# Patient Record
Sex: Female | Born: 1975 | Race: Black or African American | Hispanic: No | Marital: Single | Smoking: Never smoker
Health system: Southern US, Community
[De-identification: ages and names within clinical notes are randomized; demographics above are authoritative.]

## PROBLEM LIST (undated history)

## (undated) DIAGNOSIS — I10 Essential (primary) hypertension: Secondary | ICD-10-CM

## (undated) DIAGNOSIS — E119 Type 2 diabetes mellitus without complications: Secondary | ICD-10-CM

## (undated) HISTORY — PX: BREAST BIOPSY: SHX20

---

## 2000-05-07 ENCOUNTER — Other Ambulatory Visit: Admission: RE | Admit: 2000-05-07 | Discharge: 2000-05-07 | Payer: Self-pay | Admitting: Obstetrics and Gynecology

## 2002-05-05 ENCOUNTER — Other Ambulatory Visit: Admission: RE | Admit: 2002-05-05 | Discharge: 2002-05-05 | Payer: Self-pay | Admitting: Obstetrics and Gynecology

## 2002-09-13 ENCOUNTER — Emergency Department (HOSPITAL_COMMUNITY): Admission: EM | Admit: 2002-09-13 | Discharge: 2002-09-13 | Payer: Self-pay | Admitting: *Deleted

## 2003-02-22 ENCOUNTER — Other Ambulatory Visit: Admission: RE | Admit: 2003-02-22 | Discharge: 2003-02-22 | Payer: Self-pay | Admitting: Obstetrics and Gynecology

## 2003-08-09 ENCOUNTER — Other Ambulatory Visit: Admission: RE | Admit: 2003-08-09 | Discharge: 2003-08-09 | Payer: Self-pay | Admitting: Obstetrics and Gynecology

## 2005-02-05 ENCOUNTER — Other Ambulatory Visit: Admission: RE | Admit: 2005-02-05 | Discharge: 2005-02-05 | Payer: Self-pay | Admitting: Obstetrics and Gynecology

## 2008-01-05 ENCOUNTER — Emergency Department (HOSPITAL_COMMUNITY): Admission: EM | Admit: 2008-01-05 | Discharge: 2008-01-05 | Payer: Self-pay | Admitting: Emergency Medicine

## 2008-02-12 ENCOUNTER — Ambulatory Visit: Payer: Self-pay | Admitting: Cardiology

## 2008-02-26 ENCOUNTER — Encounter: Payer: Self-pay | Admitting: Cardiology

## 2008-02-26 ENCOUNTER — Ambulatory Visit: Payer: Self-pay

## 2010-10-25 ENCOUNTER — Other Ambulatory Visit: Payer: Self-pay | Admitting: Obstetrics and Gynecology

## 2010-10-26 ENCOUNTER — Other Ambulatory Visit: Payer: Self-pay | Admitting: Obstetrics and Gynecology

## 2010-10-26 DIAGNOSIS — Z1231 Encounter for screening mammogram for malignant neoplasm of breast: Secondary | ICD-10-CM

## 2010-11-09 ENCOUNTER — Inpatient Hospital Stay (INDEPENDENT_AMBULATORY_CARE_PROVIDER_SITE_OTHER)
Admission: RE | Admit: 2010-11-09 | Discharge: 2010-11-09 | Disposition: A | Discharge status: Home / Self Care | Payer: 59 | Source: Ambulatory Visit | Attending: Family Medicine | Admitting: Family Medicine

## 2010-11-09 DIAGNOSIS — K5289 Other specified noninfective gastroenteritis and colitis: Secondary | ICD-10-CM

## 2010-11-09 LAB — COMPREHENSIVE METABOLIC PANEL
ALT: 101 U/L — ABNORMAL HIGH (ref 0–35)
AST: 88 U/L — ABNORMAL HIGH (ref 0–37)
Albumin: 3.5 g/dL (ref 3.5–5.2)
Calcium: 9.5 mg/dL (ref 8.4–10.5)
GFR calc Af Amer: >60 mL/min (ref >60)
Potassium: 4 mEq/L (ref 3.5–5.1)
Sodium: 139 mEq/L (ref 135–145)
Total Protein: 8.2 g/dL (ref 6.0–8.3)

## 2010-11-09 LAB — POCT I-STAT, CHEM 8
HCT: 39 % (ref 36.0–46.0)
Hemoglobin: 13.3 g/dL (ref 12.0–15.0)
Potassium: 4.6 mEq/L (ref 3.5–5.1)
Sodium: 140 mEq/L (ref 135–145)
TCO2: 29 mmol/L (ref 0–100)

## 2010-11-09 LAB — POCT URINALYSIS DIPSTICK
Nitrite: NEGATIVE
Urine Glucose, Fasting: NEGATIVE mg/dL
Urobilinogen, UA: 1 mg/dL (ref 0.0–1.0)

## 2010-11-09 LAB — POCT PREGNANCY, URINE: Preg Test, Ur: NEGATIVE

## 2010-11-17 ENCOUNTER — Ambulatory Visit
Admission: RE | Admit: 2010-11-17 | Discharge: 2010-11-17 | Disposition: A | Discharge status: Home / Self Care | Payer: 59 | Source: Ambulatory Visit | Attending: Obstetrics and Gynecology | Admitting: Obstetrics and Gynecology

## 2010-11-17 DIAGNOSIS — Z1231 Encounter for screening mammogram for malignant neoplasm of breast: Secondary | ICD-10-CM

## 2010-11-21 ENCOUNTER — Other Ambulatory Visit: Payer: Self-pay | Admitting: Obstetrics and Gynecology

## 2010-11-21 DIAGNOSIS — R928 Other abnormal and inconclusive findings on diagnostic imaging of breast: Secondary | ICD-10-CM

## 2010-11-23 ENCOUNTER — Other Ambulatory Visit (HOSPITAL_COMMUNITY): Payer: Self-pay | Admitting: Family Medicine

## 2010-11-23 DIAGNOSIS — R7989 Other specified abnormal findings of blood chemistry: Secondary | ICD-10-CM

## 2010-12-05 ENCOUNTER — Ambulatory Visit
Admission: RE | Admit: 2010-12-05 | Discharge: 2010-12-05 | Disposition: A | Discharge status: Home / Self Care | Payer: 59 | Source: Ambulatory Visit | Attending: Obstetrics and Gynecology | Admitting: Obstetrics and Gynecology

## 2010-12-05 DIAGNOSIS — R928 Other abnormal and inconclusive findings on diagnostic imaging of breast: Secondary | ICD-10-CM

## 2010-12-08 ENCOUNTER — Ambulatory Visit (HOSPITAL_COMMUNITY)
Admission: RE | Admit: 2010-12-08 | Discharge: 2010-12-08 | Disposition: A | Discharge status: Home / Self Care | Payer: 59 | Source: Ambulatory Visit | Attending: Family Medicine | Admitting: Family Medicine

## 2010-12-08 DIAGNOSIS — R7989 Other specified abnormal findings of blood chemistry: Secondary | ICD-10-CM | POA: Insufficient documentation

## 2011-01-30 NOTE — Assessment & Plan Note (Signed)
[facility]                            CARDIOLOGY OFFICE NOTE   Marie Murray                      MRN:          [id number]  DATE:02/12/2008                            DOB:          Mar 13, 1976    Marie Murray is a 35 year old female with no prior cardiac history.  I  am asked to evaluate for chest pain.  Note she typically does not have  dyspnea on exertion, orthopnea, PND, pedal edema, palpitations,  presyncope, syncope, exertional chest pain.  However, over the past  several months, she has is noticed the pain in the upper chest area.  She describes it as a choking sensation.  It does not radiate.  It is  not pleuritic, positional, nor is it related to food.  It is not  exertional.  There is no associated nausea, vomiting, shortness of  breath, or diaphoresis.  It lasts for 2 hours and resolves  spontaneously.  There is a question whether it improves with belching.  Because of the above, we were asked to further evaluate.   MEDICATIONS:  1. Metformin 1 gram p.o. daily.  2. Lisinopri1/HCTZ 25/12.5 daily.  3. Aspirin 81 mg daily.  4. Depo-Provera.  5. Actos 30 mg daily.  6. Sleeptime sleep aid.   SOCIAL HISTORY:  She does not smoke, only rarely consumes alcohol.   FAMILY HISTORY:  Positive for coronary disease.  Mother had recent  stents placed by her report.   PAST MEDICAL HISTORY:  1. Diabetes mellitus for approximately one year.  2. She also has hypertension.  3. Mild hyperlipidemia.   PAST SURGICAL HISTORY:  She has had no previous surgeries.   REVIEW OF SYSTEMS:  She denies any headaches or fevers or chills.  There  is no productive cough or hemoptysis.  There is no dysphagia,  odynophagia, melena, hematochezia.  There is no dysuria or hematuria.  No rash or seizure activity.  There is no orthopnea, PND, pedal edema.  Remaining systems are negative.   PHYSICAL EXAMINATION:  VITAL SIGNS:  Blood pressure of 118/76 and pulse  is 92.  She weighs 189 pounds.  GENERAL:  She is well-developed and somewhat obese.  She is no acute  distress at present.  She does appear depressed.  There is no peripheral  clubbing.  SKIN:  Skin is warm and dry.  BACK:  Normal.  HEENT:  Normal with normal eyelids.  NECK:  Supple with a normal upstroke bilaterally.  No bruits noted.  There is no jugular distention and no thyromegaly.  CHEST:  Chest is clear to auscultation.  Normal expansion.  CARDIOVASCULAR:  Regular rhythm.  Normal S1 and S2.  No murmurs, rubs or  gallops noted.  ABDOMEN:  Nontender.  Positive bowel sounds. No hepatosplenomegaly, no  mass appreciated.  There is no abdominal bruit.  She has 2+ femoral  pulses bilaterally.  No bruits.  EXTREMITIES:  No edema.  I palpate no cords.  She is 2+ dorsalis pedis  pulses bilaterally.  NEUROLOGIC:  Grossly intact.   Electrocardiogram shows sinus rhythm at a rate of 93.  There is  mild  left ventricle hypertrophy.  There is no ST changes.   DIAGNOSES:  1. Atypical chest pain.  Her symptoms may be GI in etiology.  However,      she does have multiple risk factors including diabetes, family      history, as well as hypertension and a question of mild      hyperlipidemia.  We will schedule a stress echocardiogram.  If it      shows  shows no abnormalities, we will not pursue further ischemia      evaluation.  She will follow up with her primary care physician if      her  symptoms persist.  2. Diabetes mellitus.  Management per primary care physician.  3. Hypertension.  Blood pressure is adequately controlled on present      medications.   We will see her back on as-needed basis, pending results of her stress  echocardiogram.     Marie Murray. Marie Som, MD, [facility]  Electronically Signed    BSC/MedQ  DD: 02/12/2008  DT: 02/12/2008  Job #: [id number]   cc:   Celso Amy, PA

## 2011-03-02 ENCOUNTER — Other Ambulatory Visit (HOSPITAL_COMMUNITY): Payer: Self-pay | Admitting: Family Medicine

## 2011-03-02 DIAGNOSIS — K824 Cholesterolosis of gallbladder: Secondary | ICD-10-CM

## 2011-03-08 ENCOUNTER — Ambulatory Visit (HOSPITAL_COMMUNITY)
Admission: RE | Admit: 2011-03-08 | Discharge: 2011-03-08 | Disposition: A | Discharge status: Home / Self Care | Payer: 59 | Source: Ambulatory Visit | Attending: Family Medicine | Admitting: Family Medicine

## 2011-03-08 DIAGNOSIS — K824 Cholesterolosis of gallbladder: Secondary | ICD-10-CM | POA: Insufficient documentation

## 2012-04-23 IMAGING — US US ABDOMEN COMPLETE
1 series · 14 of 25 positions shown · non-contrast
Comparison: None.

CLINICAL DATA: Elevated LFTs

COMPLETE ABDOMINAL ULTRASOUND

[Series 1: us abdomen complete · 0.28mm/px · 14 of 69 slices shown]
[im 1/69]
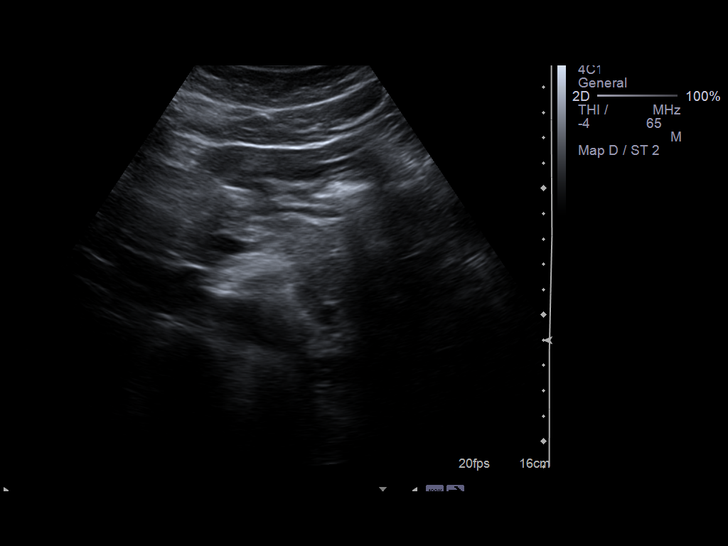
[im 6/69]
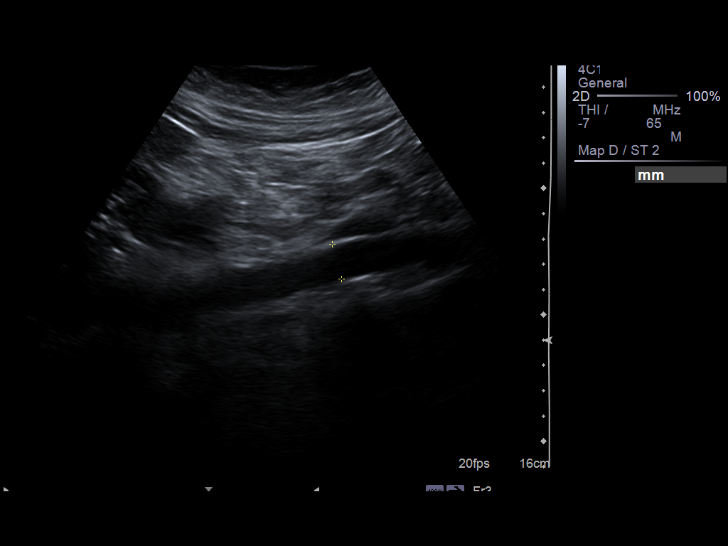
[im 12/69]
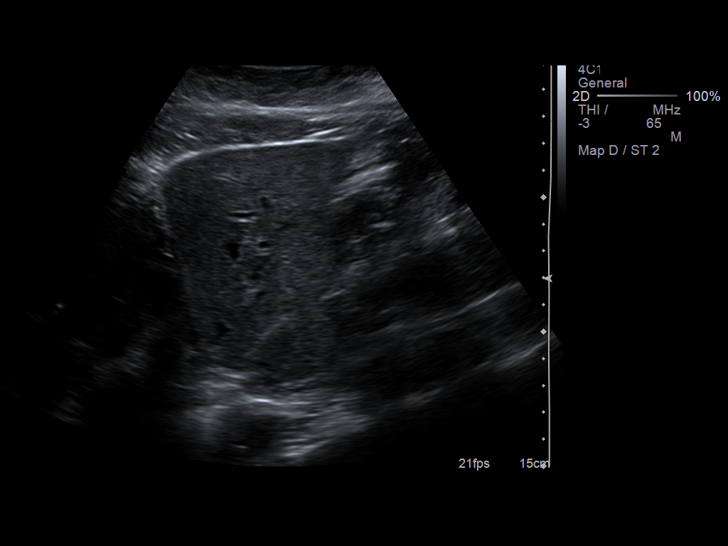
[im 18/69]
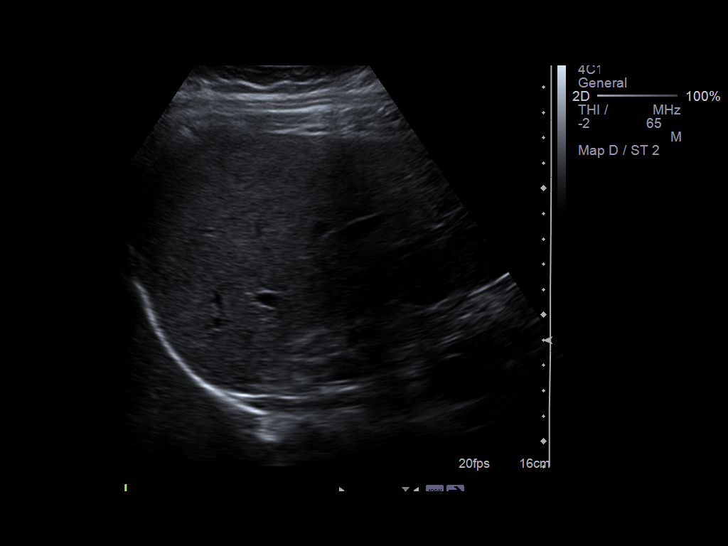
[im 23/69]
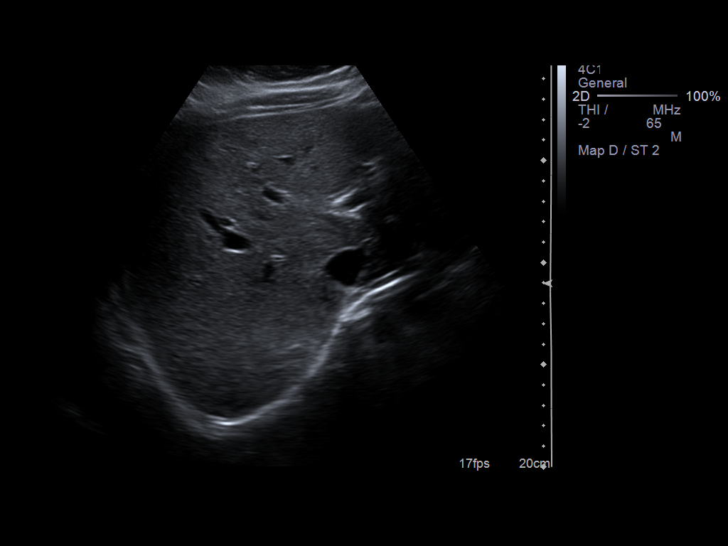
[im 26/69]
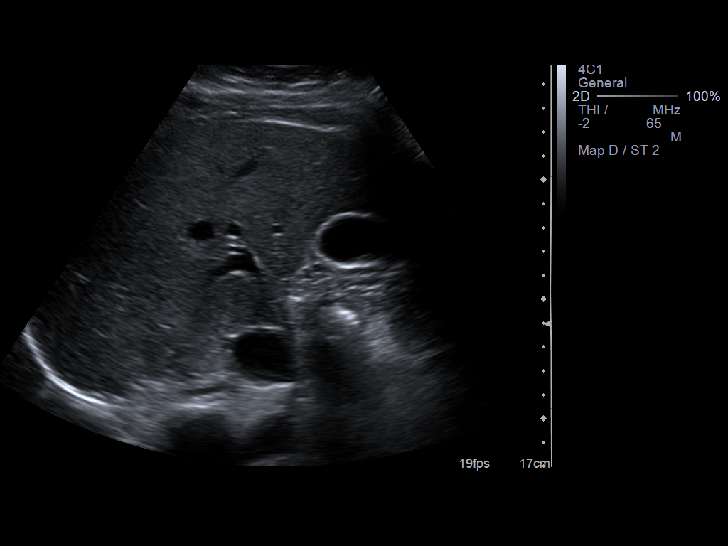
[im 32/69]
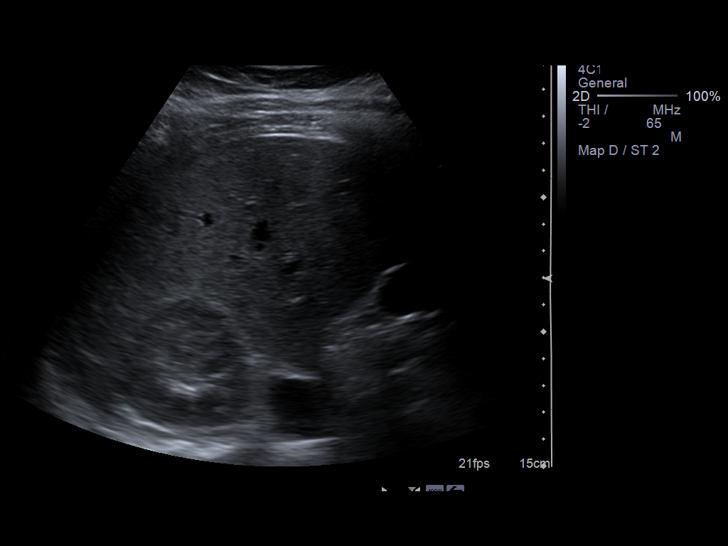
[im 37/69]
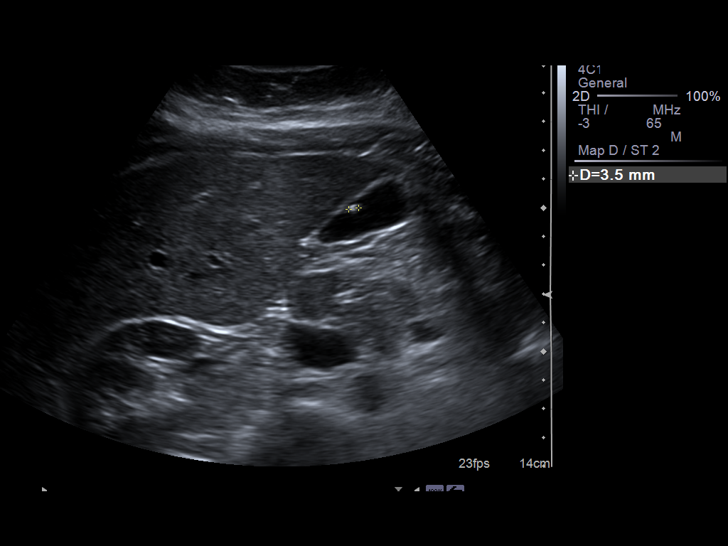
[im 43/69]
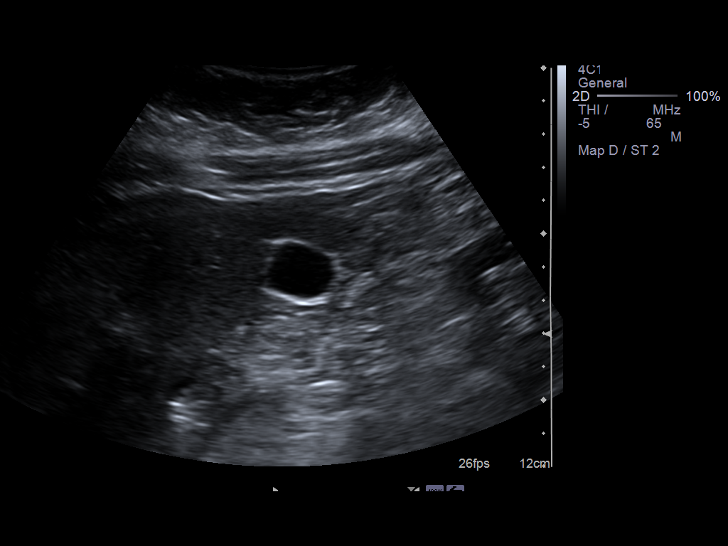
[im 46/69]
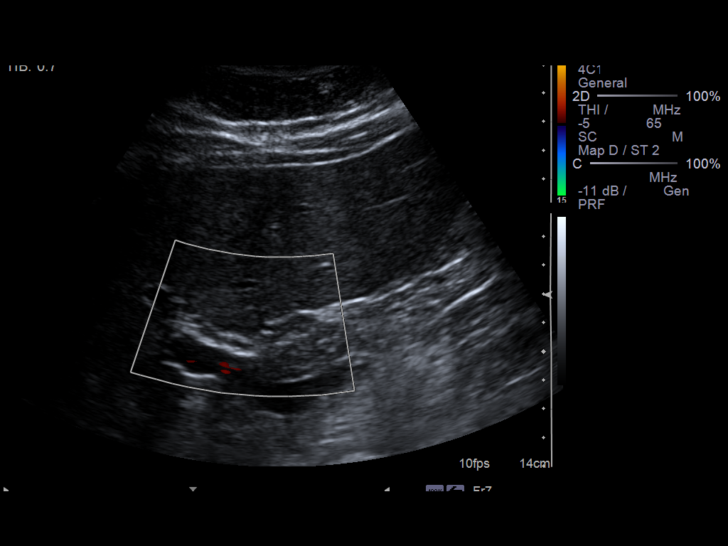
[im 52/69]
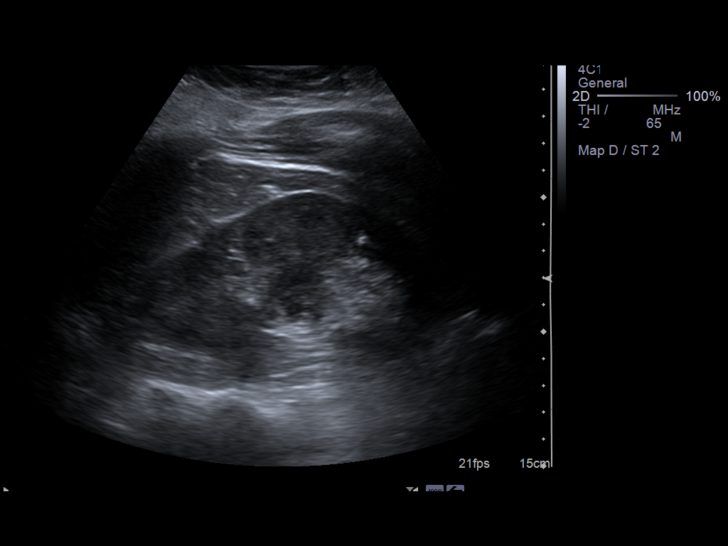
[im 57/69]
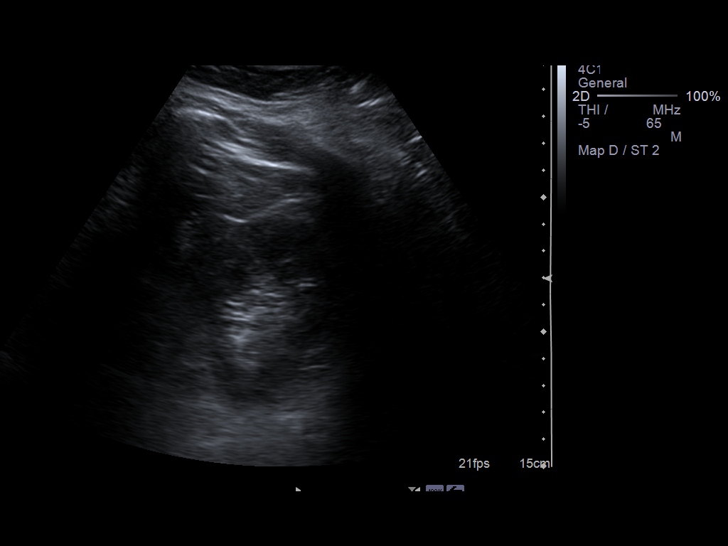
[im 63/69]
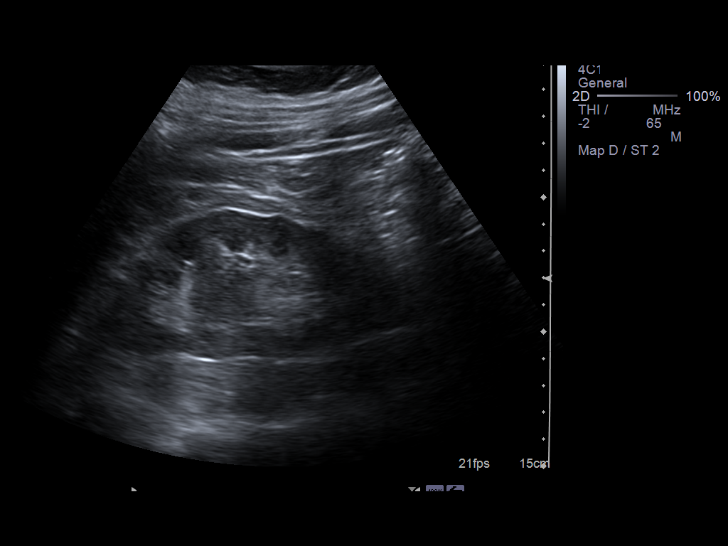
[im 69/69]
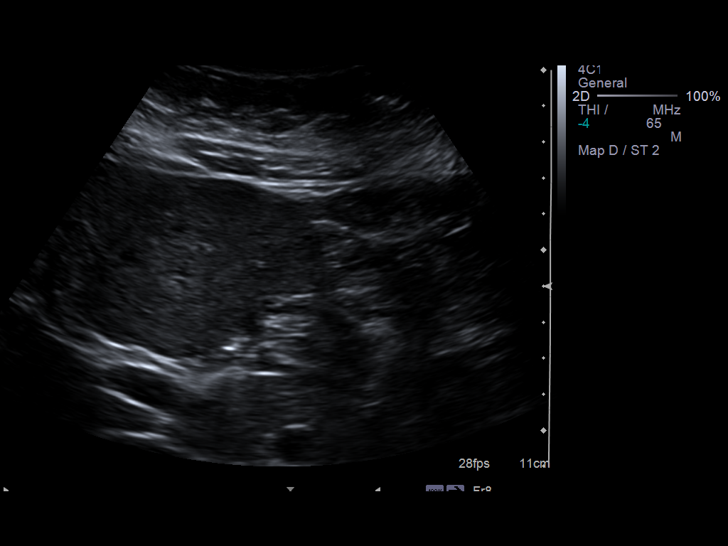

[14 of 25 positions shown; findings below may reference images not displayed]

FINDINGS: Gallbladder:  No gallstones, gallbladder wall thickening, or
pericholecystic fluid. A probable gallbladder wall polyp measures 4
mm.  Follow-up ultrasound in 3 months is suggested to assure
stability. No sonographic Murphy's sign.

Common bile duct:  Measures 2.8 mm in diameter within normal
limits.

Liver:  No focal lesion identified.  Within normal limits in
parenchymal echogenicity.

IVC:  Appears normal.

Pancreas:  No focal abnormality seen.

Spleen:  Measures 5.3 cm in length.  Normal echogenicity.

Right Kidney:  Measures 11.8 cm in length.  No hydronephrosis or
diagnostic renal calculus

Left Kidney:  Measures 11.6 cm in length.  No hydronephrosis or
diagnostic renal calculus

Abdominal aorta:  No aneurysm identified. Measures up to 1.8 cm in
diameter.
IMPRESSION: 1.  No gallstones are noted within gallbladder.  No thickening of
gallbladder wall.  No sonographic Murphy's sign.  Probable 4 mm
gallbladder wall polyp.  Follow-up ultrasound in 3 months is
suggested to assure stability.
2.  No hydronephrosis or diagnostic renal calculus.

## 2012-12-03 ENCOUNTER — Ambulatory Visit (INDEPENDENT_AMBULATORY_CARE_PROVIDER_SITE_OTHER): Payer: Self-pay | Admitting: Family Medicine

## 2012-12-03 VITALS — BP 138/88 | Wt 183.0 lb

## 2012-12-03 DIAGNOSIS — E119 Type 2 diabetes mellitus without complications: Secondary | ICD-10-CM | POA: Insufficient documentation

## 2012-12-03 NOTE — Progress Notes (Signed)
Marie Murray presents today for DM follow-up as part of the employer-sponsored [employer] program. She is getting back on track with her quarterly visits after being lost to follow-up for the previous year. Current DM regimen includes Metformin; however, Marie Murray is highly non-compliant with this. She also continues on daily ACEi for HTN. Marie Murray does not recall the last follow-up with Dr. Wynelle Link, but knows that she is due for a yearly exam.   Diabetes Assessment: Type of Diabetes: Type 2; Sees Diabetes provider 1 time a year; MD managing Diabetes Deatra James, MD; checks blood glucose 1-2 times a week; does not take medications as prescribed; does not take an aspirin a day; Highest CBG 170; Lowest CBG 88; hypoglycemia frequency None reported; A1c = 10.2 (8.0 in 11/2102) Other Diabetes History: Marie Murray did not bring meter today but reports testing 1-2 times per week.  She denies symptoms of hypo or hyperglycemia.  Marie Murray's current DM regimen includes Metformin ER 500 mg daily. However, Marie Murray has not been taking this medication regularly in quite some time (90d supply has lasted >9 months). Marie Murray reports she wants to control DM through diet and exercise alone and is attempting to "wean" herself off Metformin. She also reports GI intolerance with each dose of metformin. I have made her aware that GI intolerance will not resolve until she can maintain compliance with Metformin. At this rate, her body does not have a chance to adjust to the medication. In the past, Dr. Wynelle Link has prescribed both Metformin and Glipizide, neither of which have ever been filled. A1c today has increased to 10.2 (8.0 in 11/2011). I have discussed medications with Marie Murray. Despite positive dietary changes, Marie Murray's A1c continues to rise. At this point I do not anticipate that Marie Murray will be able to gain DM control without use of medications. We have discussed resuming Metformin at daily dosing. Have also discussed possible addition of a  second agent, but that decision will be left to Dr. Wynelle Link. Marie Murray has an appt with Dr. Wynelle Link in the next three months and will also call her today to verify that appt. I will also fax Dr. Wynelle Link with a summary of today's visit and A1c results. I have also reviewed with Marie Murray possible complications that may result if her DM is left untreated.   Lifestyle Factors: Diet - Marie Murray has made improvements since our last visit. Increased fruits/veg, is eating less fried foods, eating mostly lean protein and limiting red meat to once weekly, has eliminated pork, and is consuming more water. Pt reports portion sizes are under good control. Very few sweets. Very rarely has icecream or other sweet treats.   Exercise - Marie Murray was participating in zumba multiple times per week; however, this has stopped. She is now walking in her neighborhood for about 15 min 1-2 times per week. In addition she has a number of household chores that she believes count as exercise. Marie Murray reports enjoying group exercise classes, but is not participating in any at this time. Barriers to exercise are lack of motivation due to depression and lack of time.  Psyche - Marie Murray has had a difficult year with many emotional and financial stressors.  She admits to struggling with depression and knows this has had an impact on her self-care and motivation to exercise. Marie Murray does not wish to take medication for depression and feels that her symptoms are improving.  She also recognizes that exercise and being around others helps with this.  She is actively involved in community service  and enjoys spending time with her daughter.    Assessment: Marie Murray is a poorly controlled diabetic. A1c has increased to 10.2. Diet is under fairly good control and Marie Murray has attempted to make healthy changes. Exercise regimen is minimal at this time, and Marie Murray is hesitant to set any additional goals due to lack of time and motivation. I am hopeful that Marie Murray will  resume Metformin and will speak with Dr. Wynelle Link about a new plan for gaining control of DM. Marie Murray will see Dr. Wynelle Link within the next 3 months and will return for follow-up with [employer] [facility] in 3 months.   Plan: 1) Maintain healthy dietary changes and continue to make healthy choices 2) Continue exercising at least twice per week, walking for at least 15 minutes 3) Continue testing regularly 4) Resume Metformin at prescribed dose  5) Follow-up in 3 months on Wed June 19th @ 8:30 am

## 2013-03-04 ENCOUNTER — Ambulatory Visit (INDEPENDENT_AMBULATORY_CARE_PROVIDER_SITE_OTHER): Payer: 59 | Admitting: Family Medicine

## 2013-03-04 VITALS — BP 142/84 | Wt 183.0 lb

## 2013-03-04 DIAGNOSIS — E119 Type 2 diabetes mellitus without complications: Secondary | ICD-10-CM

## 2013-03-04 NOTE — Progress Notes (Signed)
Patient presents today for 3 month diabetes follow-up as part of the employer-sponsored [employer] program. Current diabetes regimen includes Metformin. Patient also continues on daily ACEi. Patient has not followed-up with MD but will hopefully make an appt soon. No med changes or major health changes at this time.   Diabetes Assessment: Type of Diabetes: Type 2; MD managing Diabetes Marie James, MD; checks blood glucose once daily; does not take medications as prescribed; does not take an aspirin a day; Highest CBG 180; Lowest CBG 98; A1c 9.2 (prev 10.2) Other Diabetes History: Current med regimen includes Metformin ER 500 mg twice daily. Patient reports starting this medication after our most recent follow-up, 3 months ago. She reports compliance but according to refill history she continues struggling with this. Patient does seem to be tolerating metformin except for mild GI upset on occassion. Patient did not bring meter today but is currently testing 1 times per day, at random times. She reports glucose range of 98-180. Hypoglycemia is rare. Patient denies signs and symptoms of neuropathy including numbness/tingling/burning and symptoms of foot infection. Patient is not due for yearly eye exam. She is due for follow-up with MD. I have asked her to make an appt to discuss A1c.   Lifestyle Factors: Diet - Patient is attempting to maintain a healthy diet but reports she would be interested in seeing a dietician. She reports the following for food recall:  Breakfast - [place] sausage, fruit, grits, [employer] - prepares her own lunch, often a salad with grilled chicken, pecans, vinegrette dressing Dinner - salad with cucumber, cheese, etc and a mini cheeseburger slider Snacking - attempts to make healthy choices, apple, grapes, carrots  Exercise - Patient participates in [facility] exercise in park two days per week, which often includes zumba, dance, aerobics, kick boxing, african  dance, yoga, etc. Usually lasts about an hour. Patient will soon start [employer] program. As part of this program, pt will meet with a physical trainer three days per week for 12 weeks.   Assessment: Patient is a poorly controlled diabetic. A1c has decreased to 9.2 (from 10.2) after resuming Metformin. Diet is under fairly good control and patient has attempted to maintain healthy changes. Exercise regimen has improved at this time, and will hopefully continue to improve once patient starts BELT program. Patient continues to struggle with medication compliance but has resumed Metformin. I am hopeful she will schedule a follow-up with MD soon to discuss a new plan for gaining control of DM. She will return for follow-up with [employer] [facility] in 3 months.   Plan: 1) Continue current medication regimen 2) Schedule a follow-up with Dr. Wynelle Link 3) Maintain compliance with BELT program 4) Continue making healthy dietary choices, focus on a healthier breakfast, high in fiber 5) I will contact you with info on how to see a dietician 6) Follow-up in 3 months on Wednesday September 17th @ 11:30 am

## 2013-06-08 NOTE — Progress Notes (Signed)
Patient ID: Marie Murray, female   DOB: 04/20/1976, 37 y.o.   MRN: [id number] ATTENDING PHYSICIAN NOTE: I have reviewed the chart and agree with the plan as detailed above. Denny Levy MD Pager [phone]

## 2013-07-14 ENCOUNTER — Ambulatory Visit (INDEPENDENT_AMBULATORY_CARE_PROVIDER_SITE_OTHER): Payer: Self-pay | Admitting: Family Medicine

## 2013-07-14 VITALS — BP 128/78 | Wt 180.0 lb

## 2013-07-14 DIAGNOSIS — E119 Type 2 diabetes mellitus without complications: Secondary | ICD-10-CM

## 2013-07-14 NOTE — Progress Notes (Signed)
Patient presents today for 3 month diabetes follow-up as part of the employer-sponsored [employer] program. Current diabetes regimen includes Metformin and glipizide. Patient also continues on daily ACEi. Most recent MD follow-up was 06/10/13. Patient has a pending appt for December. No med changes or major health changes at this time.   Diabetes Assessment: Type of Diabetes: Type 2; Sees Diabetes provider 1 time a year; MD managing Diabetes Marie James, MD; uses glucometer; does not take medications as prescribed; does not take an aspirin a day; Highest CBG 170; Lowest CBG 88; hypoglycemia frequency None reported; checks blood glucose once daily; A1c of 9.7 on 06/10/13 (prev 9.16 February 2013) Other Diabetes History: Current med regimen includes Metformin ER 500 mg, two tablets once daily and glipizide 10 mg daily. She reports compliance but according to refill history she continues struggling with this. She has had Metformin filled only once in the previous year. Patient does seem to be tolerating resuming metformin except for mild GI upset on occassion. Patient did not bring meter today but is currently testing 1 times per day, fasting. Highest reading of 280, after a meal. Lowest 79, fasting. Normally fasting average of 150-170. Hypoglycemia is rare. Patient denies signs and symptoms of neuropathy including numbness/tingling/burning and symptoms of foot infection. Patient is not due for yearly eye exam. She is not due for follow-up with MD. I have asked her to make an appt to discuss A1c.   Lifestyle Factors: Diet - No major changes. Patient has attempted to decrease fried foods and processed foods. Patient is attempting to maintain a healthy diet.  Breakfast - brown sugar/raisin oatmeal, cereal on the weekends Lunch - prepares her own lunch, often a salad with grilled chicken, pecans, vinegrette dressing Dinner - salad with cucumber, cheese, etc and a mini cheeseburger slider Snacking - attempts to  make healthy choices, apple, grapes, carrots  Exercise - No routine exercise at this time. Time is the limiting factor. Patient plans to resume [employer] zumba class. Was going twice weekly. Did join BELT program, but was unable to finish due to work schedule.   Assessment: Patient is a poorly controlled diabetic. A1c has increased to 9.7 (from 9.2). Diet is under fairly good control and patient has attempted to maintain healthy changes, but could use continued improvement in this area. Patient is not exercising at this time but will hopefully resume zumba classes soon. Patient continues to struggle with medication compliance but has resumed Metformin and glipizide. She will return for follow-up with [employer] [facility] in 3 months.   Plan: 1) Attempt to improve medication compliance 2) Attempt to resume exercise at least twice per week 3) Continue testing regularly, consider testing before bed 4) Follow-up in 3 months on Tuesday, Jan 27th @ 4:00 pm

## 2013-09-28 NOTE — Progress Notes (Signed)
Patient ID: Marie Murray, female   DOB: 1975/10/13, 38 y.o.   MRN: [id number] ,[facility]

## 2013-10-27 ENCOUNTER — Ambulatory Visit (INDEPENDENT_AMBULATORY_CARE_PROVIDER_SITE_OTHER): Payer: Self-pay | Admitting: Family Medicine

## 2013-10-27 VITALS — BP 132/89 | Wt 180.0 lb

## 2013-10-27 DIAGNOSIS — E119 Type 2 diabetes mellitus without complications: Secondary | ICD-10-CM

## 2013-10-27 NOTE — Progress Notes (Signed)
Patient presents today for 3 month diabetes follow-up as part of the employer-sponsored [employer] program. Current diabetes regimen includes Metformin and glipizide. Patient also continues on daily ACEi. Most recent MD follow-up was September 2015. Patient is due to schedule a follow-up exam. No med changes or major health changes at this time.   Diabetes Assessment: Type of Diabetes: Type 2; Sees Diabetes provider 1 time a year; MD managing Diabetes Deatra JamesVyvyan Sun, MD; uses glucometer; does not take medications as prescribed; does not take an aspirin a day; Highest CBG 280; Lowest CBG 89; hypoglycemia frequency None reported; checks blood glucose once daily; A1c 9.6 Other Diabetes History: Current med regimen includes Metformin ER 500 mg, 1 tablet twice daily and glipizide 10 mg daily. She reports compliance but according to refill history she continues struggling with this. Patient does seem to be tolerating resuming metformin except for mild GI upset on occassion. Patient did not bring meter today but is currently testing 1 times per day, fasting, and occassionally more often (up to 3 times daily when needed). Per patient report fasting glucose is between 165-185. Highest reading of 270, after a meal. Lowest 89 and 101. Hypoglycemia is rare. Patient denies signs and symptoms of neuropathy including numbness/tingling/burning and symptoms of foot infection. Patient is not due for yearly eye exam. She is due for follow-up with MD. I have asked her to make an appt to discuss A1c and increased fatigue.   Lifestyle Factors: Diet - No major changes. Has maintained fewer fried foods. Feels adequate at controling portion sizes and has purchased a scale and uses a smaller plate now. She has also done a great job of improving snack choices. Would like to eat breakfast more consistently. Patient is attempting to maintain a healthy diet.  Exercise - No routine exercise at this time. Time is the limiting factor.  Takes the stairs at work and plays the [employer] for exercise on [employer]. Was unable to resume zumba. Is aware of the importance of exercise but does not feel she has time or energy to exercise at this time. She will ask her MD about the possibility of iron or B12 deficiency, that could be contributing to her lack of energy.   Assessment: Patient is a poorly controlled diabetic. A1c has remains elevated at 9.6 (prev 9.7). Diet is under fairly good control and patient has attempted to maintain healthy changes, but could use continued improvement in this area. Patient is not exercising at this time and does not feel she can resume exercise due to lack of both time and energy. Patient continues to struggle with medication compliance but has resumed Metformin and glipizide. She will return for follow-up with [employer] [facility] in 3 months.   Plan: 1) Schedule follow-up appt with Dr. Wynelle LinkSun a) Ask MD to check for iron or B12 deficiency 2) Continue to maintain healthy diet and continue to limit fried foods and increase vegetables 3) Attempt to resume exercise at least one day per week 4) Continue testing glucose regularly 5) Continue compliance with oral medications 6) Follow-up in 3 months on Tuesday May 12th @ 8:30 am

## 2014-01-01 ENCOUNTER — Emergency Department (HOSPITAL_COMMUNITY): Payer: 59

## 2014-01-01 ENCOUNTER — Emergency Department (HOSPITAL_COMMUNITY)
Admission: EM | Admit: 2014-01-01 | Discharge: 2014-01-01 | Disposition: A | Discharge status: Home / Self Care | Payer: 59 | Attending: Emergency Medicine | Admitting: Emergency Medicine

## 2014-01-01 ENCOUNTER — Encounter (HOSPITAL_COMMUNITY): Payer: Self-pay | Admitting: Emergency Medicine

## 2014-01-01 DIAGNOSIS — I10 Essential (primary) hypertension: Secondary | ICD-10-CM | POA: Insufficient documentation

## 2014-01-01 DIAGNOSIS — R0789 Other chest pain: Secondary | ICD-10-CM | POA: Insufficient documentation

## 2014-01-01 DIAGNOSIS — R05 Cough: Secondary | ICD-10-CM | POA: Insufficient documentation

## 2014-01-01 DIAGNOSIS — R209 Unspecified disturbances of skin sensation: Secondary | ICD-10-CM | POA: Insufficient documentation

## 2014-01-01 DIAGNOSIS — Z79899 Other long term (current) drug therapy: Secondary | ICD-10-CM | POA: Insufficient documentation

## 2014-01-01 DIAGNOSIS — R059 Cough, unspecified: Secondary | ICD-10-CM | POA: Insufficient documentation

## 2014-01-01 DIAGNOSIS — E119 Type 2 diabetes mellitus without complications: Secondary | ICD-10-CM | POA: Insufficient documentation

## 2014-01-01 HISTORY — DX: Type 2 diabetes mellitus without complications: E11.9

## 2014-01-01 HISTORY — DX: Essential (primary) hypertension: I10

## 2014-01-01 LAB — CBC
HCT: 37.4 % (ref 36.0–46.0)
HEMOGLOBIN: 12.8 g/dL (ref 12.0–15.0)
MCH: 29.6 pg (ref 26.0–34.0)
MCHC: 34.2 g/dL (ref 30.0–36.0)
MCV: 86.6 fL (ref 78.0–100.0)
Platelets: 276 10*3/uL (ref 150–400)
RBC: 4.32 MIL/uL (ref 3.87–5.11)
RDW: 13.2 % (ref 11.5–15.5)
WBC: 7.4 10*3/uL (ref 4.0–10.5)

## 2014-01-01 LAB — I-STAT TROPONIN, ED: TROPONIN I, POC: 0 ng/mL (ref 0.00–0.08)

## 2014-01-01 LAB — BASIC METABOLIC PANEL
BUN: 10 mg/dL (ref 6–23)
CALCIUM: 9.5 mg/dL (ref 8.4–10.5)
CHLORIDE: 100 meq/L (ref 96–112)
CO2: 22 meq/L (ref 19–32)
CREATININE: 0.71 mg/dL (ref 0.50–1.10)
GFR calc Af Amer: >90 mL/min (ref >90)
GFR calc non Af Amer: >90 mL/min (ref >90)
GLUCOSE: 291 mg/dL — AB (ref 70–99)
Potassium: 4.1 mEq/L (ref 3.7–5.3)
SODIUM: 138 meq/L (ref 137–147)

## 2014-01-01 LAB — PRO B NATRIURETIC PEPTIDE: PRO B NATRI PEPTIDE: 25.3 pg/mL (ref 0–125)

## 2014-01-01 LAB — D-DIMER, QUANTITATIVE (NOT AT ARMC): D DIMER QUANT: 0.33 ug{FEU}/mL (ref 0.00–0.48)

## 2014-01-01 NOTE — Discharge Instructions (Signed)
Chest Pain Observation °It is often hard to give a specific diagnosis for the cause of chest pain. Among other possibilities your symptoms might be caused by inadequate oxygen delivery to your heart (angina). Angina that is not treated or evaluated can lead to a heart attack (myocardial infarction) or death. °Blood tests, electrocardiograms, and X-rays may have been done to help determine a possible cause of your chest pain. After evaluation and observation, your health care provider has determined that it is unlikely your pain was caused by an unstable condition that requires hospitalization. However, a full evaluation of your pain may need to be completed, with additional diagnostic testing as directed. It is very important to keep your follow-up appointments. Not keeping your follow-up appointments could result in permanent heart damage, disability, or death. If there is any problem keeping your follow-up appointments, you must call your health care provider. °HOME CARE INSTRUCTIONS  °Due to the slight chance that your pain could be angina, it is important to follow your health care provider's treatment plan and also maintain a healthy lifestyle: °· Maintain or work toward achieving a healthy weight. °· Stay physically active and exercise regularly. °· Decrease your salt intake. °· Eat a balanced, healthy diet. Talk to a dietician to learn about heart healthy foods. °· Increase your fiber intake by including whole grains, vegetables, fruits, and nuts in your diet. °· Avoid situations that cause stress, anger, or depression. °· Take medicines as advised by your health care provider. Report any side effects to your health care provider. Do not stop medicines or adjust the dosages on your own. °· Quit smoking. Do not use nicotine patches or gum until you check with your health care provider. °· Keep your blood pressure, blood sugar, and cholesterol levels within normal limits. °· Limit alcohol intake to no more than  1 drink per day for women that are not pregnant and 2 drinks per day for men. °· Do not abuse drugs. °SEEK IMMEDIATE MEDICAL CARE IF: °You have severe chest pain or pressure which may include symptoms such as: °· You feel pain or pressure in you arms, neck, jaw, or back. °· You have severe back or abdominal pain, feel sick to your stomach (nauseous), or throw up (vomit). °· You are sweating profusely. °· You are having a fast or irregular heartbeat. °· You feel short of breath while at rest. °· You notice increasing shortness of breath during rest, sleep, or with activity. °· You have chest pain that does not get better after rest or after taking your usual medicine. °· You wake from sleep with chest pain. °· You are unable to sleep because you cannot breathe. °· You develop a frequent cough or you are coughing up blood. °· You feel dizzy, faint, or experience extreme fatigue. °· You develop severe weakness, dizziness, fainting, or chills. °Any of these symptoms may represent a serious problem that is an emergency. Do not wait to see if the symptoms will go away. Call your local emergency services (911 in the [place] Do not drive yourself to the hospital. °MAKE SURE YOU: °· Understand these instructions. °· Will watch your condition. °· Will get help right away if you are not doing well or get worse. °Document Released: 10/06/2010 Document Revised: 05/06/2013 Document Reviewed: 03/05/2013 °[employer]® Patient Information ©2014 [employer]. ° °

## 2014-01-01 NOTE — ED Provider Notes (Signed)
CSN: [id number]     Arrival date & time 01/01/14  1418 History   First MD Initiated Contact with Patient 01/01/14 1647     Chief Complaint  Patient presents with  . Chest Pain     (Consider location/radiation/quality/duration/timing/severity/associated sxs/prior Treatment) HPI  38 year old female with history of non-insulin-dependent diabetes, hypertension who presents complaining of chest pain and shortness of breath. Patient report experiencing an acute onset of left-sided chest pain which radiates to the back and started yesterday while she was at work. Pain is described as a dull and achy sensation, worsening with taking deep breath and with movement. She also experiencing intermittent funny sensation to her left arm that lasted briefly. She endorses occasional cough but no hemoptysis. Initially she thought it would related to a pulled muscle but denies any heavy lifting. She also thought it may be GERD but it does not feel like her usual GERD. She otherwise denies having fever, chills, headache, runny nose sneezing, or sore throat. Denies any nausea vomiting or diarrhea, no abdominal pain, no dysuria, no numbness or weakness, no lightheadedness or dizziness. Does admits to taking birth control pills, but denies any prior history of PE, DVT, recent surgery, prolonged bed rest, unilateral leg swelling or calf pain, or history of cancer. Patient is a nonsmoker but does have family history of cardiac disease. She also report having a cardiac stress test 6 years ago that was negative for cardiology and subsequently diagnosed with GERD. No specific treatment tried.   Past Medical History  Diagnosis Date  . Hypertension   . Diabetes mellitus without complication    History reviewed. No pertinent past surgical history. History reviewed. No pertinent family history. History  Substance Use Topics  . Smoking status: Never Smoker   . Smokeless tobacco: Not on file  . Alcohol Use: Yes     Comment:  social   OB History   Grav Para Term Preterm Abortions TAB SAB Ect Mult Living                 Review of Systems  All other systems reviewed and are negative.     Allergies  Review of patient's allergies indicates not on file.  Home Medications   Prior to Admission medications   Medication Sig Start Date End Date Taking? Authorizing Provider  glipiZIDE (GLUCOTROL) 10 MG tablet Take 10 mg by mouth daily with breakfast.    Historical Provider, MD  lisinopril-hydrochlorothiazide (PRINZIDE,ZESTORETIC) 20-12.5 MG per tablet Take 1 tablet by mouth daily.    Historical Provider, MD  metFORMIN (GLUCOPHAGE-XR) 500 MG 24 hr tablet Take 1,000 mg by mouth daily.     Historical Provider, MD  Multiple Vitamin (MULTIVITAMIN) tablet Take 1 tablet by mouth daily.    Historical Provider, MD  zolpidem (AMBIEN) 10 MG tablet Take 10 mg by mouth at bedtime as needed for sleep.    Historical Provider, MD   BP 145/97  Pulse 99  Temp(Src) 99.1 F (37.3 C) (Oral)  Resp 18  SpO2 100% Physical Exam  Nursing note and vitals reviewed. Constitutional: She is oriented to person, place, and time. She appears well-developed and well-nourished. No distress.  Awake, alert, nontoxic appearance  HENT:  Head: Atraumatic.  Eyes: Conjunctivae are normal. Right eye exhibits no discharge. Left eye exhibits no discharge.  Neck: Neck supple.  Cardiovascular: Normal rate, regular rhythm and intact distal pulses.  Exam reveals no gallop and no friction rub.   No murmur heard. Pulmonary/Chest: Effort normal and breath  sounds normal. No respiratory distress. She has no wheezes. She has no rales. She exhibits tenderness (mild left side chest tenderness to anterior/lateral/posterior chest near L shoulder without overlying skin changes, crepitus, or emphysema).  Abdominal: Soft. There is no tenderness. There is no rebound.  Musculoskeletal: She exhibits no edema and no tenderness.  ROM appears intact, no obvious focal  weakness  Neurological: She is alert and oriented to person, place, and time.  Mental status and motor strength appears intact  Skin: No rash noted.  Psychiatric: She has a normal mood and affect.    ED Course  Procedures (including critical care time)  5:22 PM Pleuritic cp, takes BCP, no significant cardiac history.  Work up initiated.    6:48 PM D-dimer negative.  Trop and ECG unremarkable, CXR without acute changes, normal H&H, and electrolytes are reassuring except elevated CBG of 291, pt did not take his diabetic medication today.  I recommend pt taking her diabetic medication and f/u closely with her PCP.  Pt able to ambulate without difficulty while maintaining normal O2 level. Pt felt comfortable going home.  Will give referral to cardiology for outpt f/u.    Labs Review Labs Reviewed  BASIC METABOLIC PANEL - Abnormal; Notable for the following:    Glucose, Bld 291 (*)    All other components within normal limits  CBC  PRO B NATRIURETIC PEPTIDE  I-STAT TROPOININ, ED    Imaging Review Dg Chest 2 View  01/01/2014   CLINICAL DATA:  Left-sided chest pain radiating to the back  EXAM: CHEST  2 VIEW  COMPARISON:  None.  FINDINGS: The heart size and mediastinal contours are within normal limits. Both lungs are clear. The visualized skeletal structures are unremarkable. Lungs are hypoaerated with crowding of the bronchovascular markings.  IMPRESSION: Borderline and low lung volumes with crowding of the bronchovascular markings but no focal lobar consolidation.   Electronically Signed   By: Christiana PellantGretchen  Green M.D.   On: 01/01/2014 15:45     EKG Interpretation   Date/Time:  Friday January 01 2014 14:23:07 EDT Ventricular Rate:  94 PR Interval:  152 QRS Duration: 80 QT Interval:  356 QTC Calculation: 445 R Axis:   -21 Text Interpretation:  Normal sinus rhythm Left ventricular hypertrophy  Abnormal ECG Confirmed by Rubin PayorPICKERING  MD, Harrold DonathNATHAN [phone]([id number]) on 01/01/2014  4:52:30 PM      MDM    Final diagnoses:  Chest pain, musculoskeletal    BP 127/71  Pulse 98  Temp(Src) 99.1 F (37.3 C) (Oral)  Resp 20  SpO2 98%  I have reviewed nursing notes and vital signs. I personally reviewed the imaging tests through PACS system  I reviewed available ER/hospitalization records thought the EMR     Fayrene HelperBowie Krissi Willaims, [place] JerseyPA-C 01/01/14 1859

## 2014-01-01 NOTE — ED Notes (Signed)
Pt presents to ED with Left side chest pain radiating through to her back, SOB, increase pain with deep breathing, and Left arm "feeling funny" x2 days, pt reports increase pain to her Left shoulder last lat night. Pt denies cough, congestion, fever, dizziness, weakness, or abd pain

## 2014-01-02 NOTE — ED Provider Notes (Signed)
Medical screening examination/treatment/procedure(s) were performed by non-physician practitioner and as supervising physician I was immediately available for consultation/collaboration.   EKG Interpretation   Date/Time:  Friday January 01 2014 14:23:07 EDT Ventricular Rate:  94 PR Interval:  152 QRS Duration: 80 QT Interval:  356 QTC Calculation: 445 R Axis:   -21 Text Interpretation:  Normal sinus rhythm Left ventricular hypertrophy  Abnormal ECG Confirmed by Rubin PayorPICKERING  MD, Konica Stankowski [phone]([id number]) on 01/01/2014  4:52:30 PM       Juliet RudeNathan R. Rubin PayorPickering, MD 01/02/14 2009

## 2014-04-21 ENCOUNTER — Encounter: Payer: Self-pay | Admitting: Family Medicine

## 2014-04-21 NOTE — Progress Notes (Signed)
Patient ID: Marie Murray, female   DOB: 1975-10-29, 38 y.o.   MRN: [id number] Reviewed: Agree with our pharmacologist's documentation and management.

## 2014-04-21 NOTE — Progress Notes (Signed)
Patient ID: Marie Murray, female   DOB: 06/20/1976, 37 y.o.   MRN: [id number] Reviewed: Agree with our pharmacologist's documentation and management. 

## 2015-09-30 MED FILL — TANZEUM 30 MG PEN INJECT: 30 | 28 days supply | Qty: 4 | Fill #5

## 2015-09-30 MED FILL — ROSUVASTATIN CALCIUM 10 MG: 10 | 30 days supply | Qty: 30 | Fill #5

## 2015-09-30 MED FILL — JANUVIA 100 MG TABLET: 100 | 30 days supply | Qty: 30 | Fill #6

## 2015-09-30 MED FILL — LISINOPRIL-HCTZ 20-12.5 MG: 20-12.5 | 30 days supply | Qty: 30 | Fill #6

## 2015-09-30 MED FILL — INVOKANA 300 MG TABLET: 300 | 30 days supply | Qty: 30 | Fill #6

## 2015-12-08 MED FILL — INVOKANA 300 MG TABLET: 300 | 30 days supply | Qty: 30 | Fill #0

## 2015-12-08 MED FILL — LISINOPRIL-HCTZ 20-12.5 MG: 20-12.5 | 30 days supply | Qty: 30 | Fill #0

## 2015-12-08 MED FILL — JANUVIA 100 MG TABLET: 100 | 30 days supply | Qty: 30 | Fill #0

## 2016-02-23 MED FILL — INVOKANA 300 MG TABLET: 300 | 30 days supply | Qty: 30 | Fill #0

## 2016-02-23 MED FILL — JANUVIA 100 MG TABLET: 100 | 30 days supply | Qty: 30 | Fill #0

## 2016-02-23 MED FILL — TANZEUM 30 MG PEN INJECT: 30 | 28 days supply | Qty: 4 | Fill #0

## 2016-02-28 MED FILL — LISINOPRIL-HCTZ 20-12.5 MG: 20-12.5 | 30 days supply | Qty: 30 | Fill #0

## 2016-04-19 MED FILL — INVOKANA 300 MG TABLET: 300 | 30 days supply | Qty: 30 | Fill #1

## 2016-04-19 MED FILL — LISINOPRIL-HCTZ 20-12.5 MG: 20-12.5 | 30 days supply | Qty: 30 | Fill #1

## 2016-04-19 MED FILL — JANUVIA 100 MG TABLET: 100 | 30 days supply | Qty: 30 | Fill #1

## 2016-04-19 MED FILL — TANZEUM 30 MG PEN INJECT: 30 | 28 days supply | Qty: 4 | Fill #1

## 2016-07-30 MED FILL — INVOKANA 300 MG TABLET: 300 | 30 days supply | Qty: 30 | Fill #2

## 2016-07-30 MED FILL — LISINOPRIL-HCTZ 20-12.5 MG: 20-12.5 | 30 days supply | Qty: 30 | Fill #2

## 2016-07-30 MED FILL — JANUVIA 100 MG TABLET: 100 | 30 days supply | Qty: 30 | Fill #2

## 2016-07-30 MED FILL — TANZEUM 30 MG PEN INJECT: 30 | 28 days supply | Qty: 4 | Fill #2

## 2016-10-25 MED FILL — INVOKANA 300 MG TABLET: 300 | 30 days supply | Qty: 30 | Fill #0

## 2016-10-25 MED FILL — BYDUREON BCise 2 MG/0.85ML: 2 | 28 days supply | Qty: 3 | Fill #0

## 2016-10-29 MED FILL — JANUVIA 100 MG TABLET: 100 | 30 days supply | Qty: 30 | Fill #3

## 2016-10-29 MED FILL — LISINOPRIL-HCTZ 20-12.5 MG: 20-12.5 | 30 days supply | Qty: 30 | Fill #3

## 2016-11-30 MED FILL — BYDUREON BCise 2 MG/0.85ML: 2 | 28 days supply | Qty: 3 | Fill #1

## 2016-12-13 MED FILL — LISINOPRIL-HCTZ 20-12.5 MG: 20-12.5 | 30 days supply | Qty: 30 | Fill #4

## 2016-12-13 MED FILL — INVOKANA 300 MG TABLET: 300 | 30 days supply | Qty: 30 | Fill #1

## 2016-12-13 MED FILL — JANUVIA 100 MG TABLET: 100 | 30 days supply | Qty: 30 | Fill #4

## 2017-01-03 MED FILL — BYDUREON BCise 2 MG/0.85ML: 2 | 28 days supply | Qty: 3 | Fill #2

## 2017-02-08 MED FILL — BYDUREON BCise 2 MG/0.85ML: 2 | 28 days supply | Qty: 3 | Fill #3

## 2017-02-27 MED FILL — INVOKANA 300 MG TABLET: 300 | 30 days supply | Qty: 30 | Fill #2

## 2017-02-27 MED FILL — LISINOPRIL-HCTZ 20-12.5 MG: 20-12.5 | 30 days supply | Qty: 30 | Fill #5

## 2017-02-27 MED FILL — JANUVIA 100 MG TABLET: 100 | 30 days supply | Qty: 30 | Fill #0

## 2017-03-07 MED FILL — BYDUREON BCise 2 MG/0.85ML: 2 | 28 days supply | Qty: 3 | Fill #4

## 2017-03-15 MED FILL — FLUCONAZOLE 150 MG TABLET: 150 | 4 days supply | Qty: 2 | Fill #0

## 2017-05-02 MED FILL — BYDUREON BCise 2 MG/0.85ML: 2 | 28 days supply | Qty: 3 | Fill #5

## 2017-05-29 MED FILL — INVOKANA 300 MG TABLET: 300 | 30 days supply | Qty: 30 | Fill #3

## 2017-05-29 MED FILL — JANUVIA 100 MG TABLET: 100 | 30 days supply | Qty: 30 | Fill #1

## 2017-05-29 MED FILL — LISINOPRIL-HCTZ 20-12.5 MG: 20-12.5 | 30 days supply | Qty: 30 | Fill #0

## 2017-09-23 MED FILL — LISINOPRIL-HCTZ 20-12.5 MG: 20-12.5 | 30 days supply | Qty: 30 | Fill #0

## 2017-09-23 MED FILL — INVOKANA 300 MG TABLET: 300 | 30 days supply | Qty: 30 | Fill #0

## 2017-09-23 MED FILL — JANUVIA 100 MG TABLET: 100 | 30 days supply | Qty: 30 | Fill #0

## 2017-11-11 MED FILL — LISINOPRIL-HCTZ 20-12.5 MG: 20-12.5 | 30 days supply | Qty: 30 | Fill #1

## 2017-11-11 MED FILL — JANUVIA 100 MG TABLET: 100 | 30 days supply | Qty: 30 | Fill #1

## 2017-11-11 MED FILL — INVOKANA 300 MG TABLET: 300 | 30 days supply | Qty: 30 | Fill #1

## 2018-02-14 MED FILL — JANUVIA 100 MG TABLET: 100 | 30 days supply | Qty: 30 | Fill #2

## 2018-02-14 MED FILL — LISINOPRIL-HCTZ 20-12.5 MG: 20-12.5 | 30 days supply | Qty: 30 | Fill #2

## 2018-02-14 MED FILL — INVOKANA 300 MG TABLET: 300 | 30 days supply | Qty: 30 | Fill #2

## 2018-03-03 MED FILL — FLUCONAZOLE 150 MG TABS: 150 | 2 days supply | Qty: 2 | Fill #0

## 2018-03-06 MED FILL — BACLOFEN 10 MG TABLET: 10 | 15 days supply | Qty: 45 | Fill #0

## 2018-03-06 MED FILL — RIZATRIPTAN BENZOATE 10 MG: 10 | 30 days supply | Qty: 10 | Fill #0

## 2018-03-06 MED FILL — VENLAFAXINE HCL ER 37.5 MG: 37.5 | 30 days supply | Qty: 30 | Fill #0

## 2018-03-06 MED FILL — TOPIRAMATE 25 MG TAB: 25 | 30 days supply | Qty: 90 | Fill #0

## 2018-04-03 MED FILL — VENLAFAXINE HCL ER 37.5 MG: 37.5 | 30 days supply | Qty: 30 | Fill #1

## 2018-04-07 MED FILL — LISINOPRIL-HCTZ 20-12.5 MG: 20-12.5 | 30 days supply | Qty: 30 | Fill #3

## 2018-04-07 MED FILL — INVOKANA 300 MG TABLET: 300 | 30 days supply | Qty: 30 | Fill #0

## 2018-04-07 MED FILL — JANUVIA 100 MG TABLET: 100 | 30 days supply | Qty: 30 | Fill #0

## 2018-04-21 MED FILL — BYDUREON BCise 2 MG/0.85ML: 2 | 28 days supply | Qty: 3 | Fill #0

## 2018-04-21 MED FILL — RIZATRIPTAN BENZOATE 10 MG: 10 | 30 days supply | Qty: 18 | Fill #0

## 2018-04-22 MED FILL — ATORVASTATIN CALCIUM 20 MG: 20 | 30 days supply | Qty: 30 | Fill #0

## 2018-04-30 MED FILL — VENLAFAXINE HCL ER 37.5 MG: 37.5 | 30 days supply | Qty: 30 | Fill #2

## 2018-04-30 MED FILL — JANUVIA 100 MG TABLET: 100 | 30 days supply | Qty: 30 | Fill #1

## 2018-04-30 MED FILL — TOPIRAMATE 25 MG TAB: 25 | 30 days supply | Qty: 90 | Fill #1

## 2018-04-30 MED FILL — INVOKANA 300 MG TABLET: 300 | 30 days supply | Qty: 30 | Fill #1

## 2018-04-30 MED FILL — LISINOPRIL-HCTZ 20-12.5 MG: 20-12.5 | 30 days supply | Qty: 30 | Fill #4

## 2018-06-02 MED FILL — ATORVASTATIN CALCIUM 20 MG: 20 | 30 days supply | Qty: 30 | Fill #1

## 2018-06-12 MED FILL — TOPIRAMATE 25 MG TAB: 25 | 30 days supply | Qty: 90 | Fill #2

## 2018-06-12 MED FILL — BYDUREON BCise 2 MG/0.85ML: 2 | 28 days supply | Qty: 3 | Fill #1

## 2018-06-12 MED FILL — JANUVIA 100 MG TABLET: 100 | 30 days supply | Qty: 30 | Fill #2

## 2018-06-12 MED FILL — VENLAFAXINE HCL ER 37.5 MG: 37.5 | 30 days supply | Qty: 30 | Fill #0

## 2018-06-12 MED FILL — LISINOPRIL-HCTZ 20-12.5 MG: 20-12.5 | 30 days supply | Qty: 30 | Fill #5

## 2018-06-12 MED FILL — INVOKANA 300 MG TABLET: 300 | 30 days supply | Qty: 30 | Fill #2

## 2018-07-09 MED FILL — VENLAFAXINE HCL ER 37.5 MG: 37.5 | 30 days supply | Qty: 30 | Fill #1

## 2018-07-21 MED FILL — ATORVASTATIN CALCIUM 20 MG: 20 | 30 days supply | Qty: 30 | Fill #2

## 2018-07-21 MED FILL — INVOKANA 300 MG TABLET: 300 | 30 days supply | Qty: 30 | Fill #0

## 2018-07-21 MED FILL — JANUVIA 100 MG TABLET: 100 | 30 days supply | Qty: 30 | Fill #0

## 2018-07-21 MED FILL — LISINOPRIL-HCTZ 20-12.5 MG: 20-12.5 | 30 days supply | Qty: 30 | Fill #0

## 2018-08-01 MED FILL — VENLAFAXINE HCL ER 37.5 MG: 37.5 | 30 days supply | Qty: 60 | Fill #0

## 2018-09-19 MED FILL — TOPIRAMATE 25 MG TAB: 25 | 30 days supply | Qty: 90 | Fill #3

## 2018-09-19 MED FILL — VENLAFAXINE HCL ER 37.5 MG: 37.5 | 30 days supply | Qty: 60 | Fill #1 | Status: TO

## 2018-09-19 MED FILL — ATORVASTATIN CALCIUM 20 MG: 20 | 30 days supply | Qty: 30 | Fill #3 | Status: TO

## 2019-08-12 ENCOUNTER — Other Ambulatory Visit: Payer: Self-pay

## 2019-08-12 DIAGNOSIS — Z20822 Contact with and (suspected) exposure to covid-19: Secondary | ICD-10-CM

## 2019-08-13 LAB — NOVEL CORONAVIRUS, NAA: SARS-CoV-2, NAA: NOT DETECTED

## 2019-08-14 ENCOUNTER — Telehealth: Payer: Self-pay | Admitting: General Practice

## 2019-08-14 NOTE — Telephone Encounter (Signed)
Negative COVID results given. Patient results "NOT Detected." Caller expressed understanding. ° °

## 2019-08-24 ENCOUNTER — Other Ambulatory Visit: Payer: Self-pay

## 2019-08-24 DIAGNOSIS — Z20822 Contact with and (suspected) exposure to covid-19: Secondary | ICD-10-CM

## 2019-08-26 LAB — NOVEL CORONAVIRUS, NAA: SARS-CoV-2, NAA: NOT DETECTED

## 2019-11-21 ENCOUNTER — Ambulatory Visit: Payer: 59 | Attending: Internal Medicine

## 2019-11-21 DIAGNOSIS — Z23 Encounter for immunization: Secondary | ICD-10-CM | POA: Insufficient documentation

## 2019-11-21 NOTE — Progress Notes (Signed)
   Covid-19 Vaccination Clinic  Name:  Eleri Ruben    MRN: [id number] DOB: [birth year]  11/21/2019  Ms. Bigos was observed post Covid-19 immunization for 15 minutes without incident. She was provided with Vaccine Information Sheet and instruction to access the V-Safe system.   Ms. Chappelle was instructed to call 911 with any severe reactions post vaccine: Marland Kitchen Difficulty breathing  . Swelling of face and throat  . A fast heartbeat  . A bad rash all over body  . Dizziness and weakness   Immunizations Administered    Name Date Dose VIS Date Route   Pfizer COVID-19 Vaccine 11/21/2019  1:21 PM 0.3 mL 08/28/2019 Intramuscular   Manufacturer: [employer], [employer]   Lot: [id number]   NDC: [address]-6

## 2019-12-22 ENCOUNTER — Ambulatory Visit: Payer: 59 | Attending: Internal Medicine

## 2019-12-22 DIAGNOSIS — Z23 Encounter for immunization: Secondary | ICD-10-CM

## 2019-12-22 NOTE — Progress Notes (Signed)
   Covid-19 Vaccination Clinic  Name:  Marie Murray    MRN: [id number] DOB: 1976-02-01  12/22/2019  [name] was observed post Covid-19 immunization for 15 minutes without incident. She was provided with Vaccine Information Sheet and instruction to access the V-Safe system.   [name] was instructed to call 911 with any severe reactions post vaccine: Marland Kitchen Difficulty breathing  . Swelling of face and throat  . A fast heartbeat  . A bad rash all over body  . Dizziness and weakness   Immunizations Administered    Name Date Dose VIS Date Route   Pfizer COVID-19 Vaccine 12/22/2019  9:22 AM 0.3 mL 08/28/2019 Intramuscular   Manufacturer: [employer], [employer]   Lot: [id number]   NDC: [address]-1

## 2020-09-22 ENCOUNTER — Other Ambulatory Visit: Payer: Self-pay | Admitting: Obstetrics and Gynecology

## 2020-09-22 DIAGNOSIS — R928 Other abnormal and inconclusive findings on diagnostic imaging of breast: Secondary | ICD-10-CM

## 2020-09-22 DIAGNOSIS — N6320 Unspecified lump in the left breast, unspecified quadrant: Secondary | ICD-10-CM

## 2020-10-07 ENCOUNTER — Other Ambulatory Visit: Payer: 59

## 2020-10-19 ENCOUNTER — Inpatient Hospital Stay: Admission: RE | Admit: 2020-10-19 | Payer: 59 | Source: Ambulatory Visit

## 2021-04-27 ENCOUNTER — Other Ambulatory Visit: Payer: Self-pay

## 2021-04-27 ENCOUNTER — Ambulatory Visit (HOSPITAL_COMMUNITY)
Admission: EM | Admit: 2021-04-27 | Discharge: 2021-04-27 | Disposition: A | Discharge status: Home / Self Care | Payer: No Typology Code available for payment source | Attending: Internal Medicine | Admitting: Internal Medicine

## 2021-04-27 ENCOUNTER — Encounter (HOSPITAL_COMMUNITY): Payer: Self-pay

## 2021-04-27 DIAGNOSIS — S63502A Unspecified sprain of left wrist, initial encounter: Secondary | ICD-10-CM | POA: Diagnosis not present

## 2021-04-27 MED ORDER — IBUPROFEN 600 MG PO TABS: 600.0000 mg | ORAL_TABLET | Freq: Four times a day (QID) | ORAL | 0 refills | Status: AC | PRN

## 2021-04-27 NOTE — Discharge Instructions (Addendum)
Gentle range of motion exercises Ice your wrist after returning from work/vigorous physical activity Take anti-inflammatory as needed If symptoms persist, please return to urgent care to be reevaluated

## 2021-04-27 NOTE — ED Triage Notes (Signed)
Pt presents with a injury to the left wrist. States she was [place] her luggage on the airplane and heard a bone pop. States it is difficult to grasp objects and states her whole arm aches.

## 2021-04-30 NOTE — ED Provider Notes (Signed)
[facility]    CSN: [id number] Arrival date & time: 04/27/21  1801      History   Chief Complaint Chief Complaint  Patient presents with   Wrist Injury    HPI Marie Murray is a 45 y.o. female comes to the urgent care with left wrist pain which started several days ago.  Patient was lifting her luggage airplane prior to onset of her left wrist pain.  No significant trauma to the left wrist.  No swelling.  No nausea or vomiting.  No deformity.  Pain is currently a 2 out of 10, throbbing, aggravated by repetitive use of her left hand and denies any known relieving factors.  She has been using a wrist brace intermittently.  She has not taken NSAIDs for pain.   HPI  Past Medical History:  Diagnosis Date   Diabetes mellitus without complication (HCC)    Hypertension     Patient Active Problem List   Diagnosis Date Noted   Diabetes (HCC) 12/03/2012    History reviewed. No pertinent surgical history.  OB History   No obstetric history on file.      Home Medications    Prior to Admission medications   Medication Sig Start Date End Date Taking? Authorizing Provider  ibuprofen (ADVIL) 600 MG tablet Take 1 tablet (600 mg total) by mouth every 6 (six) hours as needed. 04/27/21  Yes Anija Brickner, Britta Mccreedy, MD  glipiZIDE (GLUCOTROL) 10 MG tablet Take 10 mg by mouth daily with breakfast.    [provider]  lisinopril-hydrochlorothiazide (PRINZIDE,ZESTORETIC) 20-12.5 MG per tablet Take 1 tablet by mouth daily.    [provider]  metFORMIN (GLUCOPHAGE-XR) 500 MG 24 hr tablet Take 1,000 mg by mouth daily.     [provider]  Multiple Vitamin (MULTIVITAMIN) tablet Take 1 tablet by mouth daily.    [provider]  zolpidem (AMBIEN) 10 MG tablet Take 10 mg by mouth at bedtime as needed for sleep.    [provider]    Family History Family History  Family history unknown: Yes    Social History Social History   Tobacco Use    Smoking status: Never   Smokeless tobacco: Never  Substance Use Topics   Alcohol use: Yes    Comment: social   Drug use: No     Allergies   Metformin and related   Review of Systems Review of Systems  Musculoskeletal:  Positive for arthralgias. Negative for joint swelling and myalgias.  Skin: Negative.   Neurological: Negative.     Physical Exam Triage Vital Signs ED Triage Vitals  Enc Vitals Group     BP 04/27/21 1835 (!) 152/87     Pulse Rate 04/27/21 1835 77     Resp 04/27/21 1835 16     Temp 04/27/21 1835 98.8 F (37.1 C)     Temp Source 04/27/21 1835 Oral     SpO2 04/27/21 1835 98 %     Weight --      Height --      Head Circumference --      Peak Flow --      Pain Score 04/27/21 1834 2     Pain Loc --      Pain Edu? --      Excl. in GC? --    No data found.  Updated Vital Signs BP (!) 152/87 (BP Location: Left Arm)   Pulse 77   Temp 98.8 F (37.1 C) (Oral)  Resp 16   LMP  (LMP Unknown)   SpO2 98%   Visual Acuity Right Eye Distance:   Left Eye Distance:   Bilateral Distance:    Right Eye Near:   Left Eye Near:    Bilateral Near:     Physical Exam Vitals and nursing note reviewed.  Pulmonary:     Effort: Pulmonary effort is normal.     Breath sounds: Normal breath sounds.  Abdominal:     General: Bowel sounds are normal.     Palpations: Abdomen is soft.  Musculoskeletal:        General: Tenderness present. No swelling, deformity or signs of injury. Normal range of motion.     Comments: Tenderness on palpation over the medial aspect of the left wrist.  No swelling or deformities.  Full range of motion of the left wrist  Skin:    General: Skin is warm.     Capillary Refill: Capillary refill takes less than 2 seconds.  Neurological:     Mental Status: She is alert.     UC Treatments / Results  Labs (all labs ordered are listed, but only abnormal results are displayed) Labs Reviewed - No data to display  EKG   Radiology No  results found.  Procedures Procedures (including critical care time)  Medications Ordered in UC Medications - No data to display  Initial Impression / Assessment and Plan / UC Course  I have reviewed the triage vital signs and the nursing notes.  Pertinent labs & imaging results that were available during my care of the patient were reviewed by me and considered in my medical decision making (see chart for details).     1.  Left wrist sprain: Ibuprofen as needed for pain Continue to use wrist brace Physical exam is negative for deformity Return to urgent care if symptoms worsen Final Clinical Impressions(s) / UC Diagnoses   Final diagnoses:  Left wrist sprain, initial encounter     Discharge Instructions      Gentle range of motion exercises Ice your wrist after returning from work/vigorous physical activity Take anti-inflammatory as needed If symptoms persist, please return to urgent care to be reevaluated   ED Prescriptions     Medication Sig Dispense Auth. Provider   ibuprofen (ADVIL) 600 MG tablet Take 1 tablet (600 mg total) by mouth every 6 (six) hours as needed. 30 tablet Koleman Marling, Britta Mccreedy, MD      PDMP not reviewed this encounter.   Merrilee Jansky, MD 04/30/21 1100

## 2021-07-19 ENCOUNTER — Ambulatory Visit
Admission: RE | Admit: 2021-07-19 | Discharge: 2021-07-19 | Disposition: A | Discharge status: Home / Self Care | Payer: No Typology Code available for payment source | Source: Ambulatory Visit | Attending: Obstetrics and Gynecology | Admitting: Obstetrics and Gynecology

## 2021-07-19 ENCOUNTER — Other Ambulatory Visit: Payer: Self-pay

## 2021-07-19 ENCOUNTER — Other Ambulatory Visit: Payer: Self-pay | Admitting: Obstetrics and Gynecology

## 2021-07-19 DIAGNOSIS — R928 Other abnormal and inconclusive findings on diagnostic imaging of breast: Secondary | ICD-10-CM

## 2021-07-27 ENCOUNTER — Ambulatory Visit
Admission: RE | Admit: 2021-07-27 | Discharge: 2021-07-27 | Disposition: A | Discharge status: Home / Self Care | Payer: No Typology Code available for payment source | Source: Ambulatory Visit | Attending: Obstetrics and Gynecology | Admitting: Obstetrics and Gynecology

## 2021-07-27 ENCOUNTER — Other Ambulatory Visit: Payer: Self-pay | Admitting: Obstetrics and Gynecology

## 2021-07-27 ENCOUNTER — Other Ambulatory Visit: Payer: Self-pay

## 2021-07-27 DIAGNOSIS — R928 Other abnormal and inconclusive findings on diagnostic imaging of breast: Secondary | ICD-10-CM

## 2022-07-19 ENCOUNTER — Encounter: Payer: Self-pay | Admitting: Internal Medicine

## 2022-07-19 NOTE — Progress Notes (Deleted)
  Barnet [place] Marsella Suman,acting as a Education administrator for Maximino Greenland, MD.,have documented all relevant documentation on the behalf of Maximino Greenland, MD,as directed by  Maximino Greenland, MD while in the presence of Maximino Greenland, MD.    Subjective:     Patient ID: Marie Murray , female    DOB: Mar 07, 1976 , 46 y.o.   MRN: [id number]   Chief Complaint  Patient presents with   Establish Care    HPI  Patient presents today to establish care. Patient      Past Medical History:  Diagnosis Date   Diabetes mellitus without complication ([place])    Hypertension      Family History  Family history unknown: Yes     Current Outpatient Medications:    glipiZIDE (GLUCOTROL) 10 MG tablet, Take 10 mg by mouth daily with breakfast., Disp: , Rfl:    ibuprofen (ADVIL) 600 MG tablet, Take 1 tablet (600 mg total) by mouth every 6 (six) hours as needed., Disp: 30 tablet, Rfl: 0   lisinopril-hydrochlorothiazide (PRINZIDE,ZESTORETIC) 20-12.5 MG per tablet, Take 1 tablet by mouth daily., Disp: , Rfl:    metFORMIN (GLUCOPHAGE-XR) 500 MG 24 hr tablet, Take 1,000 mg by mouth daily. , Disp: , Rfl:    Multiple Vitamin (MULTIVITAMIN) tablet, Take 1 tablet by mouth daily., Disp: , Rfl:    zolpidem (AMBIEN) 10 MG tablet, Take 10 mg by mouth at bedtime as needed for sleep., Disp: , Rfl:    Allergies  Allergen Reactions   Metformin And Related Diarrhea     Review of Systems   There were no vitals filed for this visit. There is no height or weight on file to calculate BMI.   Objective:  Physical Exam      Assessment And Plan:     There are no diagnoses linked to this encounter.    Patient was given opportunity to ask questions. Patient verbalized understanding of the plan and was able to repeat key elements of the plan. All questions were answered to their satisfaction.  Tonie Griffith, [place], Tonie Griffith, CMA, have reviewed all documentation for this visit. The documentation on 07/19/22 for the  exam, diagnosis, procedures, and orders are all accurate and complete.   IF YOU HAVE BEEN REFERRED TO A SPECIALIST, IT MAY TAKE 1-2 WEEKS TO SCHEDULE/PROCESS THE REFERRAL. IF YOU HAVE NOT HEARD FROM US/SPECIALIST IN TWO WEEKS, PLEASE GIVE [place] A CALL AT [phone].   THE PATIENT IS ENCOURAGED TO PRACTICE SOCIAL DISTANCING DUE TO THE COVID-19 PANDEMIC.

## 2022-07-22 ENCOUNTER — Encounter: Payer: Self-pay | Admitting: Internal Medicine

## 2022-07-22 NOTE — Progress Notes (Signed)
NO SHOW

## 2022-12-11 IMAGING — MG MM BREAST BX W LOC DEV 1ST LESION IMAGE BX SPEC STEREO GUIDE*L*
8 of 11 series · 8 of 23 positions shown · non-contrast
Comparison: Previous exams.
COMPARISON: Previous exams.

Addendum:
CLINICAL DATA: Patient with possible subtle left breast distortion.

EXAM:
LEFT BREAST STEREOTACTIC CORE NEEDLE BIOPSY

[L (1 of 7)]
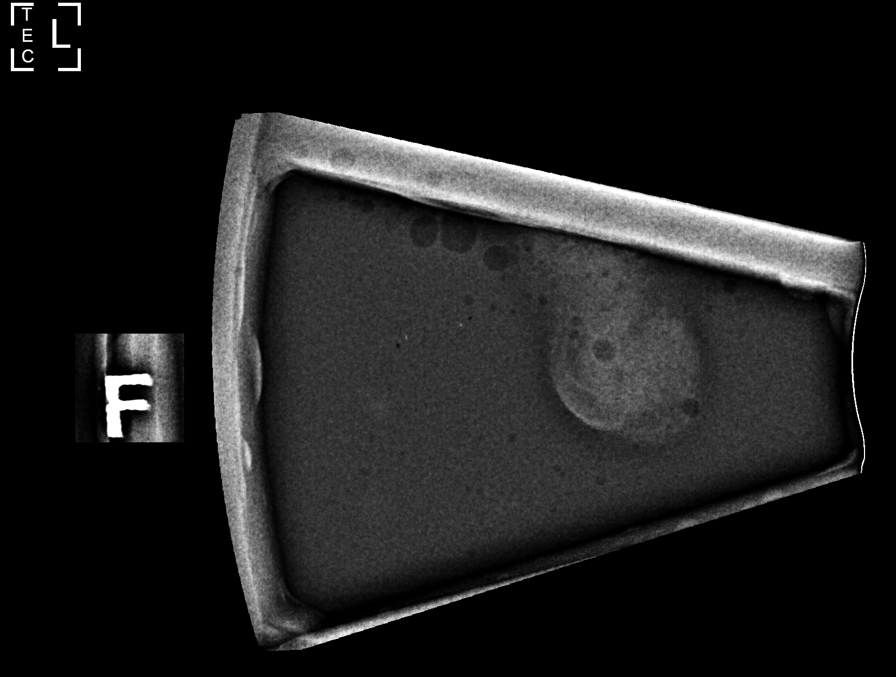

[L (2 of 7)]
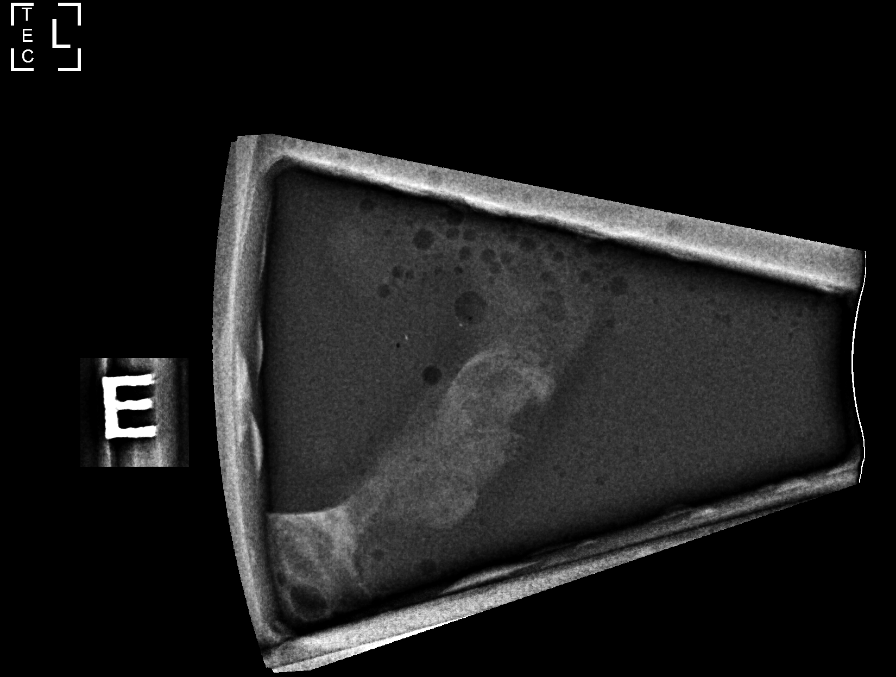

[L (3 of 7)]
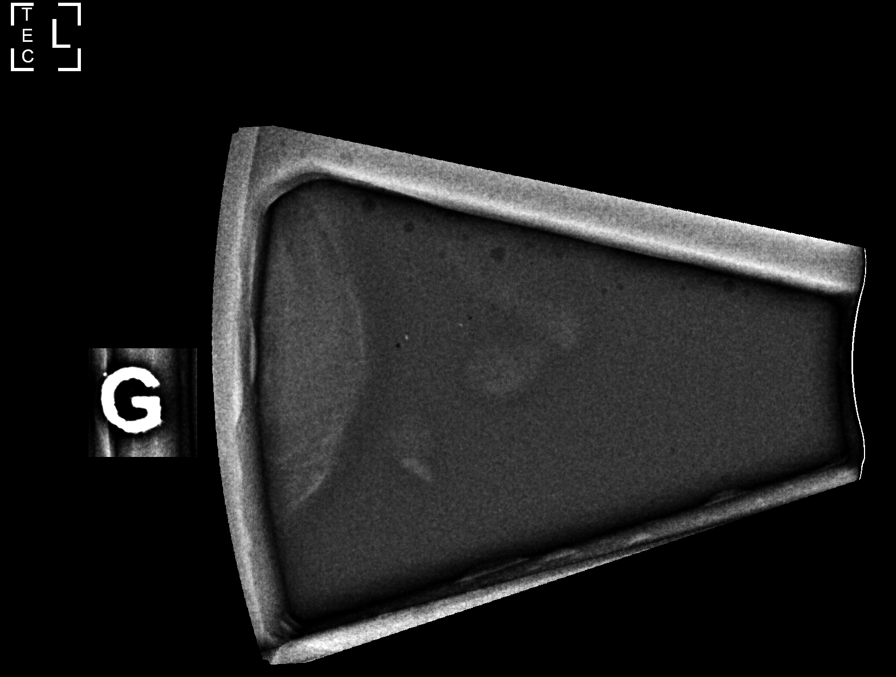

[L (4 of 7)]
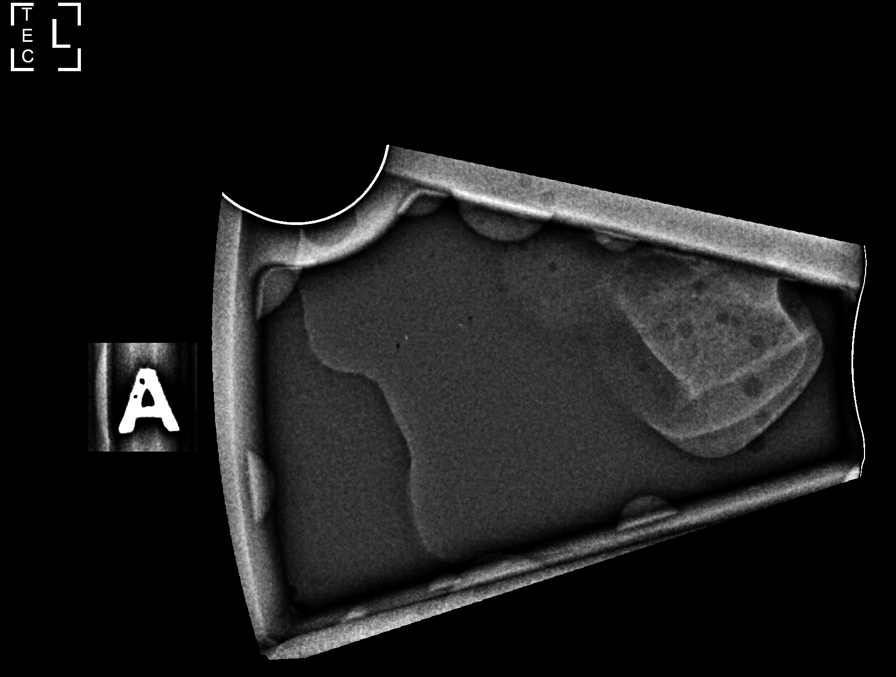

[L (5 of 7)]
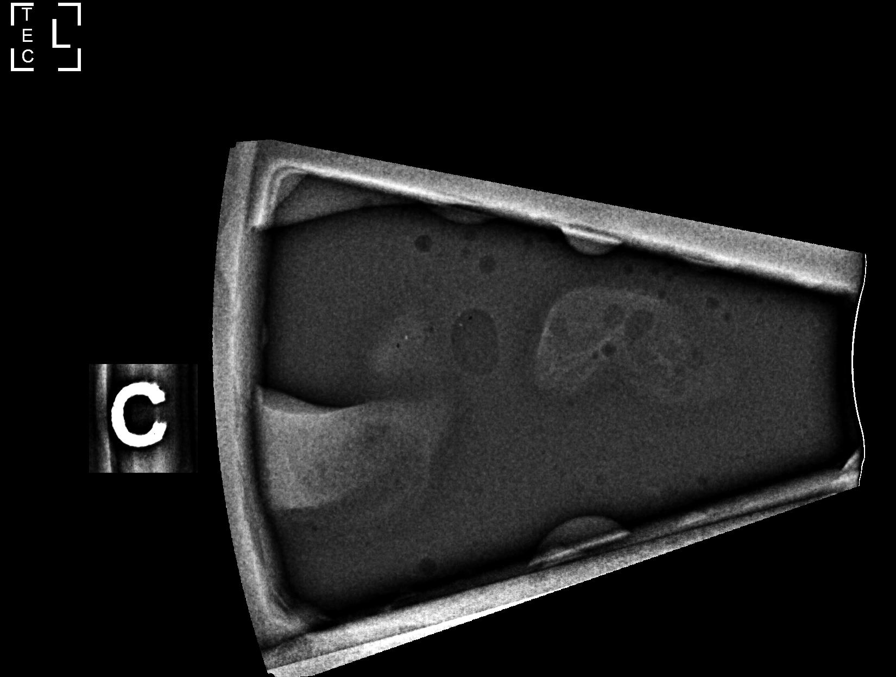

[L (6 of 7)]
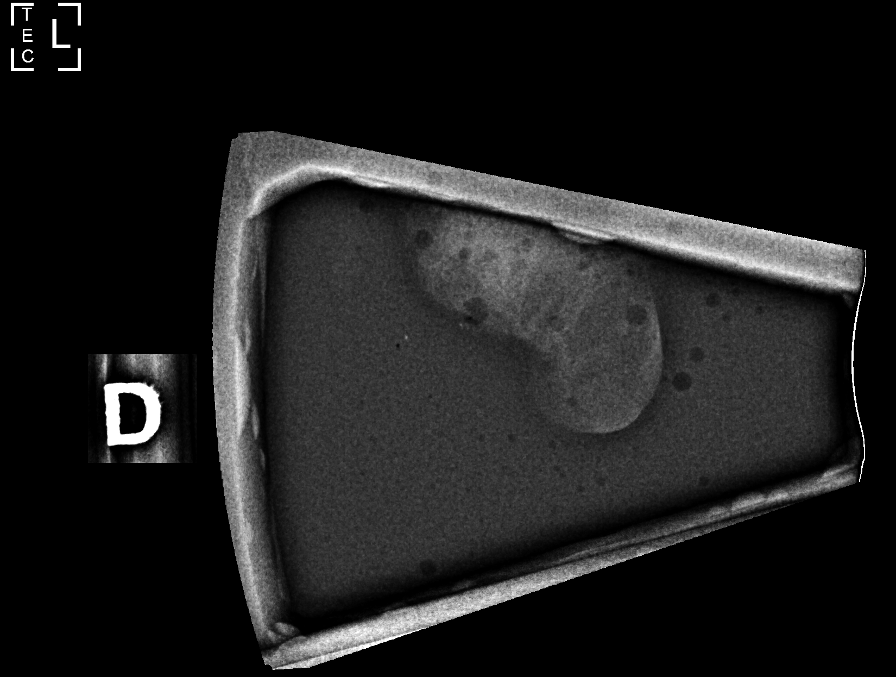

[L (7 of 7)]
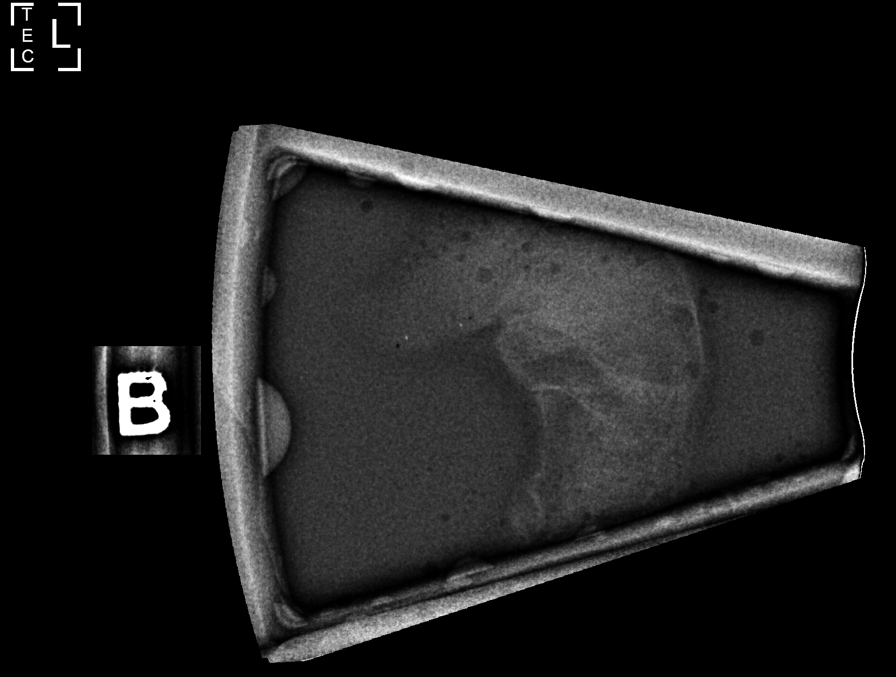

[L CC]
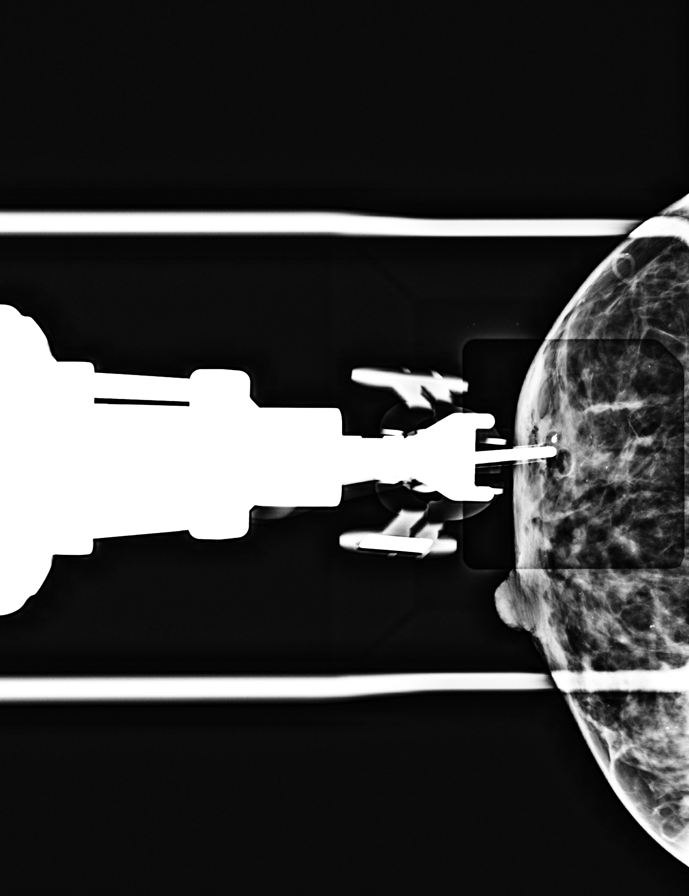

[8 of 23 positions shown; findings below may reference images not displayed]



Using sterile technique and 1% Lidocaine as local anesthetic, under
stereotactic guidance, a 9 gauge vacuum assisted device was used to
perform core needle biopsy of lower inner left breast using a
cranial approach.

Lesion quadrant: Lower inner quadrant

At the conclusion of the procedure, coil shaped tissue marker clip
was deployed into the biopsy cavity. Follow-up 2-view mammogram was
performed and dictated separately.
IMPRESSION: Stereotactic-guided biopsy of lower inner left breast. No apparent
complications.

ADDENDUM:
Pathology revealed USUAL DUCTAL HYPERPLASIA AND FIBROCYSTIC CHANGES
of the LEFT breast, lower inner, (coil clip). This was found to be
concordant by Dr. Dharma Wendling.

Pathology results were discussed with the patient by telephone. The
patient reported doing well after the biopsy with tenderness at the
site. Post biopsy instructions and care were reviewed and questions
were answered. The patient was encouraged to call The [REDACTED] for any additional concerns. My direct phone
number was provided.

The patient was asked to return for LEFT diagnostic mammography and
possible ultrasound in 6 months and informed a reminder notice would
be sent regarding this appointment.

Pathology results reported by Cu Deodato, RN on 07/31/2021.



Using sterile technique and 1% Lidocaine as local anesthetic, under
stereotactic guidance, a 9 gauge vacuum assisted device was used to
perform core needle biopsy of lower inner left breast using a
cranial approach.

Lesion quadrant: Lower inner quadrant

At the conclusion of the procedure, coil shaped tissue marker clip
was deployed into the biopsy cavity. Follow-up 2-view mammogram was
performed and dictated separately.
IMPRESSION: Stereotactic-guided biopsy of lower inner left breast. No apparent
complications.

## 2022-12-11 IMAGING — MG MM BREAST LOCALIZATION CLIP
6 series · 6 of 22 positions shown · non-contrast
Comparison: Previous exam(s).

CLINICAL DATA: Patient status post stereotactic guided biopsy left
breast subtle distortion.

EXAM:
3D DIAGNOSTIC LEFT MAMMOGRAM POST STEREOTACTIC BIOPSY

[L CC synth-2D]
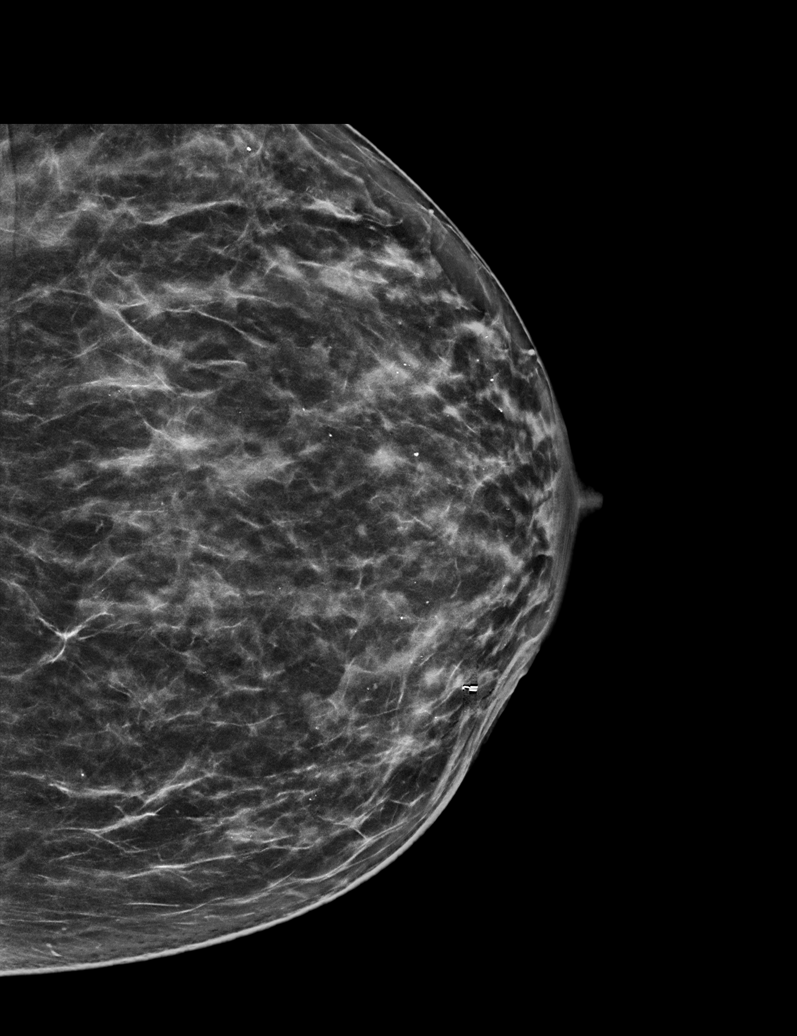

[L ML synth-2D]
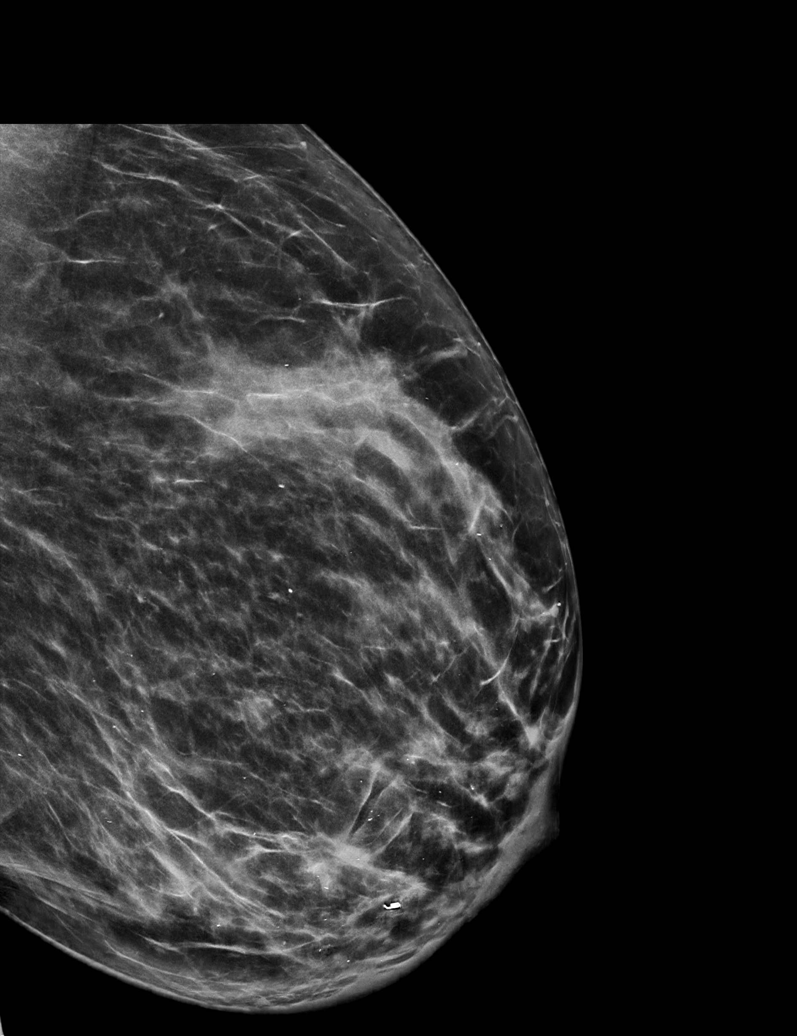

[L CC tomo (1 of 3) · tomo slice 25/50.0]
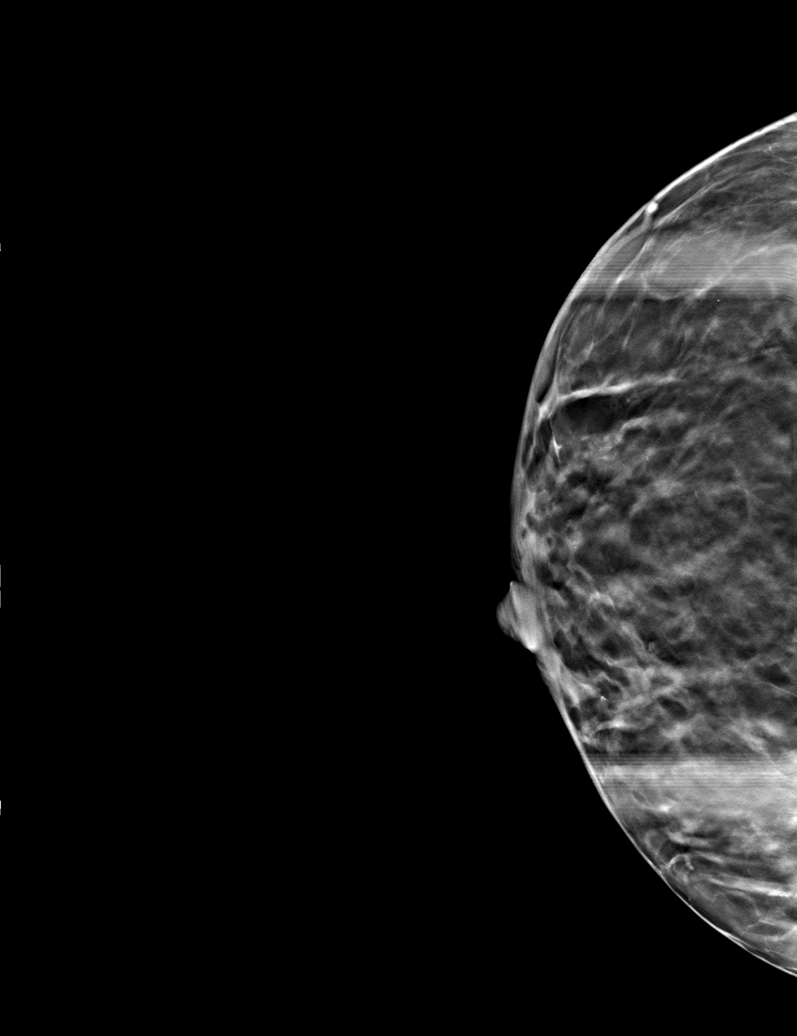

[L CC tomo (2 of 3) · tomo slice 25/50.0]
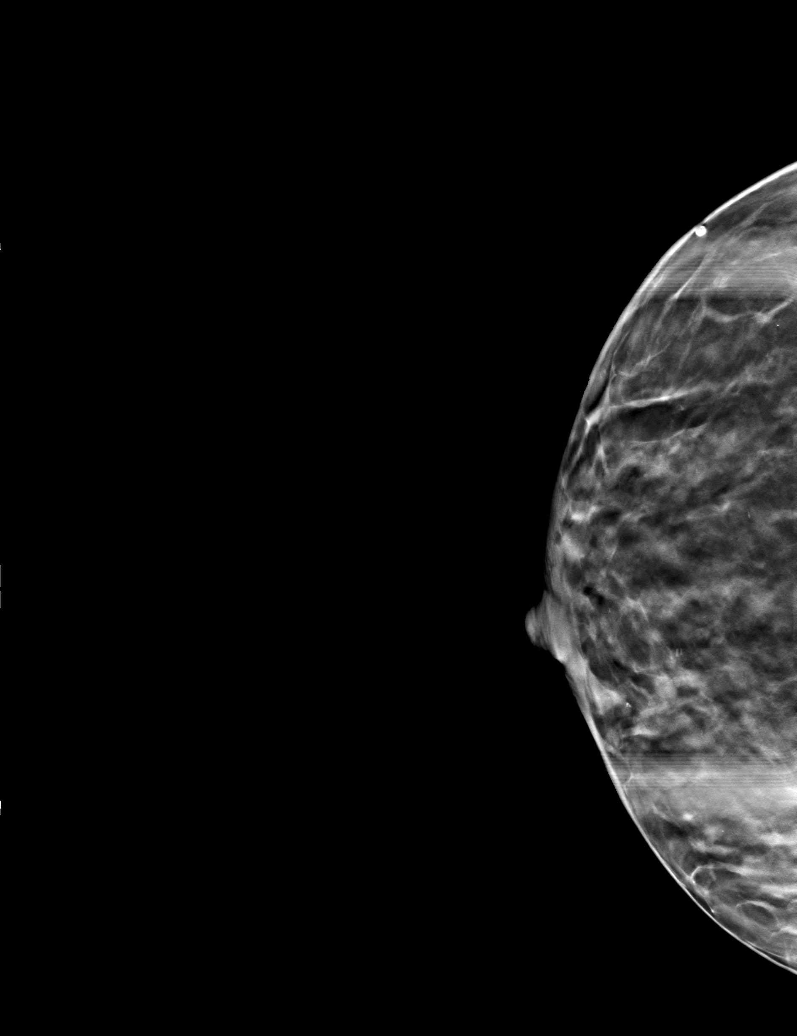

[L CC tomo (3 of 3) · tomo slice 32/63.0]
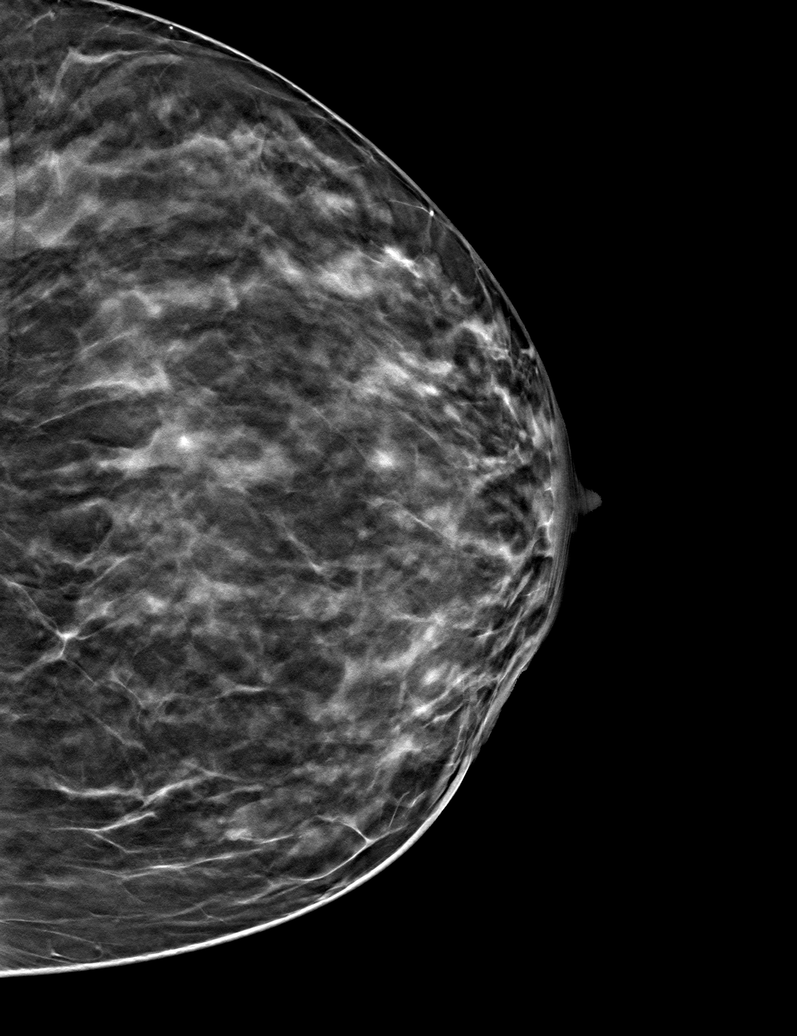

[L ML tomo · tomo slice 35/70.0]
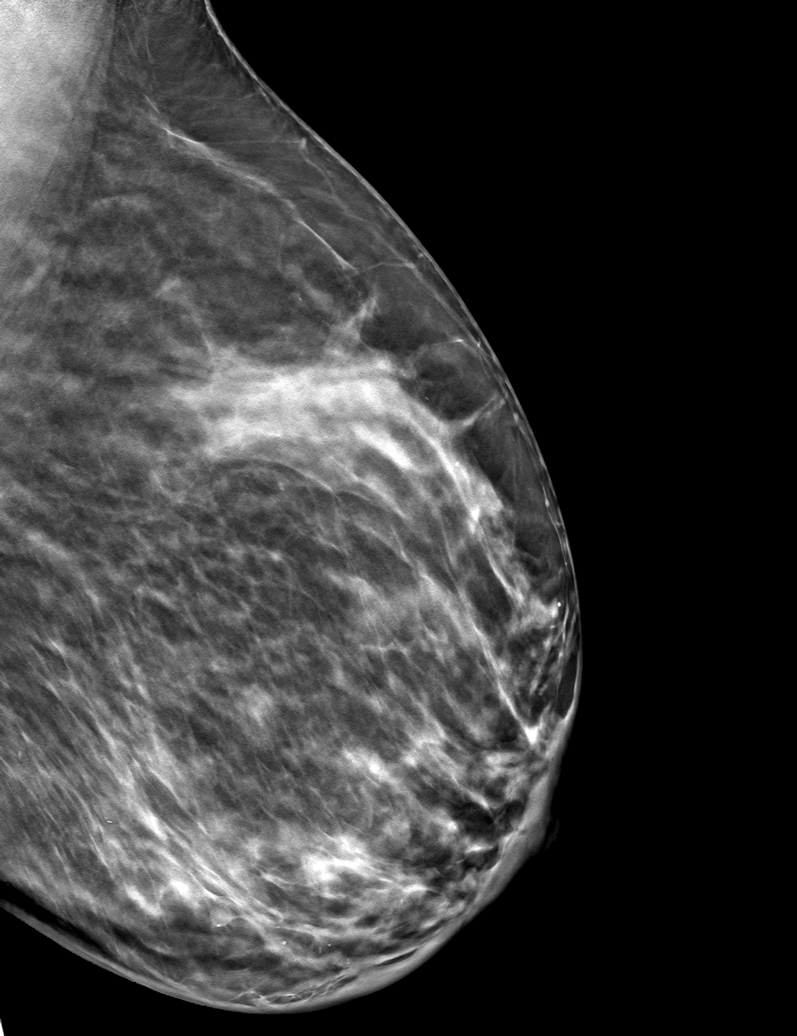

[6 of 22 positions shown; findings below may reference images not displayed]

FINDINGS: 3D Mammographic images were obtained following stereotactic guided
biopsy of subtle left breast distortion. The biopsy marking clip is
in expected position at the site of biopsy.
IMPRESSION: Appropriate positioning of the coil shaped biopsy marking clip at
the site of biopsy in the lower inner left breast.

Final Assessment: Post Procedure Mammograms for Marker Placement

## 2023-03-25 ENCOUNTER — Other Ambulatory Visit: Payer: Self-pay | Admitting: Obstetrics and Gynecology

## 2023-03-25 DIAGNOSIS — N6019 Diffuse cystic mastopathy of unspecified breast: Secondary | ICD-10-CM

## 2023-03-25 DIAGNOSIS — N6099 Unspecified benign mammary dysplasia of unspecified breast: Secondary | ICD-10-CM

## 2023-04-03 ENCOUNTER — Other Ambulatory Visit: Payer: No Typology Code available for payment source

## 2023-04-22 ENCOUNTER — Other Ambulatory Visit: Payer: No Typology Code available for payment source

## 2023-05-07 ENCOUNTER — Ambulatory Visit: Payer: No Typology Code available for payment source

## 2023-05-07 ENCOUNTER — Ambulatory Visit
Admission: RE | Admit: 2023-05-07 | Discharge: 2023-05-07 | Disposition: A | Discharge status: Home / Self Care | Payer: BC Managed Care – PPO | Source: Ambulatory Visit | Attending: Obstetrics and Gynecology | Admitting: Obstetrics and Gynecology

## 2023-05-07 DIAGNOSIS — N6019 Diffuse cystic mastopathy of unspecified breast: Secondary | ICD-10-CM

## 2023-05-07 DIAGNOSIS — N6099 Unspecified benign mammary dysplasia of unspecified breast: Secondary | ICD-10-CM

## 2023-08-02 ENCOUNTER — Other Ambulatory Visit: Payer: Self-pay | Admitting: Nurse Practitioner

## 2023-08-02 DIAGNOSIS — D320 Benign neoplasm of cerebral meninges: Secondary | ICD-10-CM

## 2023-09-05 ENCOUNTER — Encounter: Payer: Self-pay | Admitting: Nurse Practitioner

## 2023-09-09 ENCOUNTER — Ambulatory Visit
Admission: RE | Admit: 2023-09-09 | Discharge: 2023-09-09 | Disposition: A | Discharge status: Home / Self Care | Payer: BC Managed Care – PPO | Source: Ambulatory Visit | Attending: Nurse Practitioner | Admitting: Nurse Practitioner

## 2023-09-09 DIAGNOSIS — D320 Benign neoplasm of cerebral meninges: Secondary | ICD-10-CM

## 2024-02-18 ENCOUNTER — Ambulatory Visit: Admitting: Dietician

## 2024-04-01 ENCOUNTER — Ambulatory Visit: Admitting: Dietician
